# Patient Record
Sex: Female | Born: 1935 | Race: White | Hispanic: No | Smoking: Former smoker
Health system: Southern US, Community
[De-identification: ages and names within clinical notes are randomized; demographics above are authoritative.]

## PROBLEM LIST (undated history)

## (undated) DIAGNOSIS — H409 Unspecified glaucoma: Secondary | ICD-10-CM

## (undated) DIAGNOSIS — I4891 Unspecified atrial fibrillation: Secondary | ICD-10-CM

## (undated) DIAGNOSIS — J439 Emphysema, unspecified: Secondary | ICD-10-CM

## (undated) DIAGNOSIS — I509 Heart failure, unspecified: Secondary | ICD-10-CM

## (undated) HISTORY — PX: KNEE ARTHROSCOPY: SUR90

---

## 2011-03-10 DIAGNOSIS — Z7902 Long term (current) use of antithrombotics/antiplatelets: Secondary | ICD-10-CM | POA: Diagnosis not present

## 2011-03-10 DIAGNOSIS — Z7901 Long term (current) use of anticoagulants: Secondary | ICD-10-CM | POA: Diagnosis not present

## 2011-03-22 DIAGNOSIS — R791 Abnormal coagulation profile: Secondary | ICD-10-CM | POA: Diagnosis not present

## 2011-04-05 DIAGNOSIS — Z79899 Other long term (current) drug therapy: Secondary | ICD-10-CM | POA: Diagnosis not present

## 2011-04-05 DIAGNOSIS — J449 Chronic obstructive pulmonary disease, unspecified: Secondary | ICD-10-CM | POA: Diagnosis not present

## 2011-04-05 DIAGNOSIS — I4891 Unspecified atrial fibrillation: Secondary | ICD-10-CM | POA: Diagnosis not present

## 2011-04-05 DIAGNOSIS — I10 Essential (primary) hypertension: Secondary | ICD-10-CM | POA: Diagnosis not present

## 2011-04-05 DIAGNOSIS — R791 Abnormal coagulation profile: Secondary | ICD-10-CM | POA: Diagnosis not present

## 2011-04-05 DIAGNOSIS — E785 Hyperlipidemia, unspecified: Secondary | ICD-10-CM | POA: Diagnosis not present

## 2011-04-05 DIAGNOSIS — J4489 Other specified chronic obstructive pulmonary disease: Secondary | ICD-10-CM | POA: Diagnosis not present

## 2011-04-07 DIAGNOSIS — H4011X0 Primary open-angle glaucoma, stage unspecified: Secondary | ICD-10-CM | POA: Diagnosis not present

## 2011-04-26 DIAGNOSIS — R791 Abnormal coagulation profile: Secondary | ICD-10-CM | POA: Diagnosis not present

## 2011-05-06 DIAGNOSIS — S21209A Unspecified open wound of unspecified back wall of thorax without penetration into thoracic cavity, initial encounter: Secondary | ICD-10-CM | POA: Diagnosis not present

## 2011-05-17 DIAGNOSIS — R791 Abnormal coagulation profile: Secondary | ICD-10-CM | POA: Diagnosis not present

## 2011-05-19 DIAGNOSIS — R791 Abnormal coagulation profile: Secondary | ICD-10-CM | POA: Diagnosis not present

## 2011-06-01 DIAGNOSIS — R791 Abnormal coagulation profile: Secondary | ICD-10-CM | POA: Diagnosis not present

## 2011-06-17 DIAGNOSIS — Z5181 Encounter for therapeutic drug level monitoring: Secondary | ICD-10-CM | POA: Diagnosis not present

## 2011-06-17 DIAGNOSIS — Z7901 Long term (current) use of anticoagulants: Secondary | ICD-10-CM | POA: Diagnosis not present

## 2011-06-20 DIAGNOSIS — J189 Pneumonia, unspecified organism: Secondary | ICD-10-CM | POA: Diagnosis not present

## 2011-06-23 DIAGNOSIS — J189 Pneumonia, unspecified organism: Secondary | ICD-10-CM | POA: Diagnosis not present

## 2011-06-28 DIAGNOSIS — R791 Abnormal coagulation profile: Secondary | ICD-10-CM | POA: Diagnosis not present

## 2011-07-05 DIAGNOSIS — R791 Abnormal coagulation profile: Secondary | ICD-10-CM | POA: Diagnosis not present

## 2011-07-07 DIAGNOSIS — H4011X0 Primary open-angle glaucoma, stage unspecified: Secondary | ICD-10-CM | POA: Diagnosis not present

## 2011-07-09 DIAGNOSIS — J449 Chronic obstructive pulmonary disease, unspecified: Secondary | ICD-10-CM | POA: Diagnosis not present

## 2011-07-09 DIAGNOSIS — I4891 Unspecified atrial fibrillation: Secondary | ICD-10-CM | POA: Diagnosis not present

## 2011-07-09 DIAGNOSIS — J811 Chronic pulmonary edema: Secondary | ICD-10-CM | POA: Diagnosis not present

## 2011-07-09 DIAGNOSIS — I10 Essential (primary) hypertension: Secondary | ICD-10-CM | POA: Diagnosis not present

## 2011-07-09 DIAGNOSIS — E785 Hyperlipidemia, unspecified: Secondary | ICD-10-CM | POA: Diagnosis not present

## 2011-07-13 DIAGNOSIS — R791 Abnormal coagulation profile: Secondary | ICD-10-CM | POA: Diagnosis not present

## 2011-08-03 DIAGNOSIS — R791 Abnormal coagulation profile: Secondary | ICD-10-CM | POA: Diagnosis not present

## 2011-08-12 DIAGNOSIS — R791 Abnormal coagulation profile: Secondary | ICD-10-CM | POA: Diagnosis not present

## 2011-08-31 DIAGNOSIS — R791 Abnormal coagulation profile: Secondary | ICD-10-CM | POA: Diagnosis not present

## 2011-09-17 DIAGNOSIS — Z5181 Encounter for therapeutic drug level monitoring: Secondary | ICD-10-CM | POA: Diagnosis not present

## 2011-09-17 DIAGNOSIS — Z7901 Long term (current) use of anticoagulants: Secondary | ICD-10-CM | POA: Diagnosis not present

## 2011-10-07 DIAGNOSIS — H4011X0 Primary open-angle glaucoma, stage unspecified: Secondary | ICD-10-CM | POA: Diagnosis not present

## 2011-10-14 DIAGNOSIS — R791 Abnormal coagulation profile: Secondary | ICD-10-CM | POA: Diagnosis not present

## 2011-10-18 DIAGNOSIS — I4891 Unspecified atrial fibrillation: Secondary | ICD-10-CM | POA: Diagnosis not present

## 2011-10-18 DIAGNOSIS — I10 Essential (primary) hypertension: Secondary | ICD-10-CM | POA: Diagnosis not present

## 2011-10-18 DIAGNOSIS — E785 Hyperlipidemia, unspecified: Secondary | ICD-10-CM | POA: Diagnosis not present

## 2011-10-18 DIAGNOSIS — J449 Chronic obstructive pulmonary disease, unspecified: Secondary | ICD-10-CM | POA: Diagnosis not present

## 2011-11-16 DIAGNOSIS — R791 Abnormal coagulation profile: Secondary | ICD-10-CM | POA: Diagnosis not present

## 2011-12-02 DIAGNOSIS — Z23 Encounter for immunization: Secondary | ICD-10-CM | POA: Diagnosis not present

## 2011-12-06 DIAGNOSIS — E039 Hypothyroidism, unspecified: Secondary | ICD-10-CM | POA: Diagnosis not present

## 2011-12-06 DIAGNOSIS — I5030 Unspecified diastolic (congestive) heart failure: Secondary | ICD-10-CM | POA: Diagnosis not present

## 2011-12-06 DIAGNOSIS — I10 Essential (primary) hypertension: Secondary | ICD-10-CM | POA: Diagnosis not present

## 2011-12-09 DIAGNOSIS — I509 Heart failure, unspecified: Secondary | ICD-10-CM | POA: Diagnosis not present

## 2011-12-09 DIAGNOSIS — E039 Hypothyroidism, unspecified: Secondary | ICD-10-CM | POA: Diagnosis not present

## 2011-12-21 DIAGNOSIS — R791 Abnormal coagulation profile: Secondary | ICD-10-CM | POA: Diagnosis not present

## 2012-01-10 DIAGNOSIS — R791 Abnormal coagulation profile: Secondary | ICD-10-CM | POA: Diagnosis not present

## 2012-01-10 DIAGNOSIS — H4011X0 Primary open-angle glaucoma, stage unspecified: Secondary | ICD-10-CM | POA: Diagnosis not present

## 2012-01-13 DIAGNOSIS — R791 Abnormal coagulation profile: Secondary | ICD-10-CM | POA: Diagnosis not present

## 2012-01-17 DIAGNOSIS — I509 Heart failure, unspecified: Secondary | ICD-10-CM | POA: Diagnosis not present

## 2012-01-17 DIAGNOSIS — I10 Essential (primary) hypertension: Secondary | ICD-10-CM | POA: Diagnosis not present

## 2012-01-17 DIAGNOSIS — J449 Chronic obstructive pulmonary disease, unspecified: Secondary | ICD-10-CM | POA: Diagnosis not present

## 2012-01-17 DIAGNOSIS — I4891 Unspecified atrial fibrillation: Secondary | ICD-10-CM | POA: Diagnosis not present

## 2012-01-19 DIAGNOSIS — R791 Abnormal coagulation profile: Secondary | ICD-10-CM | POA: Diagnosis not present

## 2012-01-21 DIAGNOSIS — R791 Abnormal coagulation profile: Secondary | ICD-10-CM | POA: Diagnosis not present

## 2012-01-31 DIAGNOSIS — Z79899 Other long term (current) drug therapy: Secondary | ICD-10-CM | POA: Diagnosis not present

## 2012-01-31 DIAGNOSIS — R791 Abnormal coagulation profile: Secondary | ICD-10-CM | POA: Diagnosis not present

## 2012-01-31 DIAGNOSIS — R61 Generalized hyperhidrosis: Secondary | ICD-10-CM | POA: Diagnosis not present

## 2012-02-10 DIAGNOSIS — Z7901 Long term (current) use of anticoagulants: Secondary | ICD-10-CM | POA: Diagnosis not present

## 2012-03-03 DIAGNOSIS — R791 Abnormal coagulation profile: Secondary | ICD-10-CM | POA: Diagnosis not present

## 2012-03-24 DIAGNOSIS — Z7901 Long term (current) use of anticoagulants: Secondary | ICD-10-CM | POA: Diagnosis not present

## 2012-03-29 DIAGNOSIS — J209 Acute bronchitis, unspecified: Secondary | ICD-10-CM | POA: Diagnosis not present

## 2012-03-31 DIAGNOSIS — R791 Abnormal coagulation profile: Secondary | ICD-10-CM | POA: Diagnosis not present

## 2012-04-03 DIAGNOSIS — E785 Hyperlipidemia, unspecified: Secondary | ICD-10-CM | POA: Diagnosis not present

## 2012-04-03 DIAGNOSIS — M25579 Pain in unspecified ankle and joints of unspecified foot: Secondary | ICD-10-CM | POA: Diagnosis not present

## 2012-04-03 DIAGNOSIS — J449 Chronic obstructive pulmonary disease, unspecified: Secondary | ICD-10-CM | POA: Diagnosis not present

## 2012-04-03 DIAGNOSIS — I10 Essential (primary) hypertension: Secondary | ICD-10-CM | POA: Diagnosis not present

## 2012-04-03 DIAGNOSIS — I4891 Unspecified atrial fibrillation: Secondary | ICD-10-CM | POA: Diagnosis not present

## 2012-04-07 DIAGNOSIS — Z7901 Long term (current) use of anticoagulants: Secondary | ICD-10-CM | POA: Diagnosis not present

## 2012-04-11 DIAGNOSIS — H4011X0 Primary open-angle glaucoma, stage unspecified: Secondary | ICD-10-CM | POA: Diagnosis not present

## 2012-04-21 DIAGNOSIS — Z79899 Other long term (current) drug therapy: Secondary | ICD-10-CM | POA: Diagnosis not present

## 2012-04-24 DIAGNOSIS — J449 Chronic obstructive pulmonary disease, unspecified: Secondary | ICD-10-CM | POA: Diagnosis not present

## 2012-04-24 DIAGNOSIS — I4891 Unspecified atrial fibrillation: Secondary | ICD-10-CM | POA: Diagnosis not present

## 2012-05-05 DIAGNOSIS — R05 Cough: Secondary | ICD-10-CM | POA: Diagnosis not present

## 2012-05-07 DIAGNOSIS — E871 Hypo-osmolality and hyponatremia: Secondary | ICD-10-CM | POA: Diagnosis not present

## 2012-05-07 DIAGNOSIS — J441 Chronic obstructive pulmonary disease with (acute) exacerbation: Secondary | ICD-10-CM | POA: Diagnosis not present

## 2012-05-07 DIAGNOSIS — R05 Cough: Secondary | ICD-10-CM | POA: Diagnosis not present

## 2012-05-07 DIAGNOSIS — J309 Allergic rhinitis, unspecified: Secondary | ICD-10-CM | POA: Diagnosis present

## 2012-05-07 DIAGNOSIS — J9600 Acute respiratory failure, unspecified whether with hypoxia or hypercapnia: Secondary | ICD-10-CM | POA: Diagnosis not present

## 2012-05-07 DIAGNOSIS — Z87891 Personal history of nicotine dependence: Secondary | ICD-10-CM | POA: Diagnosis not present

## 2012-05-07 DIAGNOSIS — R319 Hematuria, unspecified: Secondary | ICD-10-CM | POA: Diagnosis not present

## 2012-05-07 DIAGNOSIS — I10 Essential (primary) hypertension: Secondary | ICD-10-CM | POA: Diagnosis present

## 2012-05-07 DIAGNOSIS — I509 Heart failure, unspecified: Secondary | ICD-10-CM | POA: Diagnosis present

## 2012-05-07 DIAGNOSIS — J440 Chronic obstructive pulmonary disease with acute lower respiratory infection: Secondary | ICD-10-CM | POA: Diagnosis not present

## 2012-05-07 DIAGNOSIS — Z885 Allergy status to narcotic agent status: Secondary | ICD-10-CM | POA: Diagnosis not present

## 2012-05-07 DIAGNOSIS — J209 Acute bronchitis, unspecified: Secondary | ICD-10-CM | POA: Diagnosis not present

## 2012-05-07 DIAGNOSIS — Z880 Allergy status to penicillin: Secondary | ICD-10-CM | POA: Diagnosis not present

## 2012-05-07 DIAGNOSIS — J189 Pneumonia, unspecified organism: Secondary | ICD-10-CM | POA: Diagnosis not present

## 2012-05-07 DIAGNOSIS — I059 Rheumatic mitral valve disease, unspecified: Secondary | ICD-10-CM | POA: Diagnosis not present

## 2012-05-07 DIAGNOSIS — I4891 Unspecified atrial fibrillation: Secondary | ICD-10-CM | POA: Diagnosis not present

## 2012-05-07 DIAGNOSIS — E785 Hyperlipidemia, unspecified: Secondary | ICD-10-CM | POA: Diagnosis present

## 2012-05-07 DIAGNOSIS — J9620 Acute and chronic respiratory failure, unspecified whether with hypoxia or hypercapnia: Secondary | ICD-10-CM | POA: Diagnosis present

## 2012-05-07 DIAGNOSIS — R059 Cough, unspecified: Secondary | ICD-10-CM | POA: Diagnosis not present

## 2012-05-07 DIAGNOSIS — E039 Hypothyroidism, unspecified: Secondary | ICD-10-CM | POA: Diagnosis present

## 2012-05-07 DIAGNOSIS — R9431 Abnormal electrocardiogram [ECG] [EKG]: Secondary | ICD-10-CM | POA: Diagnosis not present

## 2012-05-07 DIAGNOSIS — R0602 Shortness of breath: Secondary | ICD-10-CM | POA: Diagnosis not present

## 2012-05-07 DIAGNOSIS — Z7901 Long term (current) use of anticoagulants: Secondary | ICD-10-CM | POA: Diagnosis not present

## 2012-05-07 DIAGNOSIS — I5033 Acute on chronic diastolic (congestive) heart failure: Secondary | ICD-10-CM | POA: Diagnosis not present

## 2012-05-07 DIAGNOSIS — H409 Unspecified glaucoma: Secondary | ICD-10-CM | POA: Diagnosis present

## 2012-05-16 DIAGNOSIS — Z79899 Other long term (current) drug therapy: Secondary | ICD-10-CM | POA: Diagnosis not present

## 2012-05-16 DIAGNOSIS — R791 Abnormal coagulation profile: Secondary | ICD-10-CM | POA: Diagnosis not present

## 2012-05-19 DIAGNOSIS — R269 Unspecified abnormalities of gait and mobility: Secondary | ICD-10-CM | POA: Diagnosis not present

## 2012-05-19 DIAGNOSIS — IMO0001 Reserved for inherently not codable concepts without codable children: Secondary | ICD-10-CM | POA: Diagnosis not present

## 2012-05-19 DIAGNOSIS — I10 Essential (primary) hypertension: Secondary | ICD-10-CM | POA: Diagnosis not present

## 2012-05-19 DIAGNOSIS — I509 Heart failure, unspecified: Secondary | ICD-10-CM | POA: Diagnosis not present

## 2012-05-19 DIAGNOSIS — M6281 Muscle weakness (generalized): Secondary | ICD-10-CM | POA: Diagnosis not present

## 2012-05-22 DIAGNOSIS — I509 Heart failure, unspecified: Secondary | ICD-10-CM | POA: Diagnosis not present

## 2012-05-22 DIAGNOSIS — IMO0001 Reserved for inherently not codable concepts without codable children: Secondary | ICD-10-CM | POA: Diagnosis not present

## 2012-05-22 DIAGNOSIS — M6281 Muscle weakness (generalized): Secondary | ICD-10-CM | POA: Diagnosis not present

## 2012-05-22 DIAGNOSIS — R269 Unspecified abnormalities of gait and mobility: Secondary | ICD-10-CM | POA: Diagnosis not present

## 2012-05-22 DIAGNOSIS — I10 Essential (primary) hypertension: Secondary | ICD-10-CM | POA: Diagnosis not present

## 2012-05-24 DIAGNOSIS — M6281 Muscle weakness (generalized): Secondary | ICD-10-CM | POA: Diagnosis not present

## 2012-05-24 DIAGNOSIS — I10 Essential (primary) hypertension: Secondary | ICD-10-CM | POA: Diagnosis not present

## 2012-05-24 DIAGNOSIS — I509 Heart failure, unspecified: Secondary | ICD-10-CM | POA: Diagnosis not present

## 2012-05-24 DIAGNOSIS — IMO0001 Reserved for inherently not codable concepts without codable children: Secondary | ICD-10-CM | POA: Diagnosis not present

## 2012-05-24 DIAGNOSIS — R269 Unspecified abnormalities of gait and mobility: Secondary | ICD-10-CM | POA: Diagnosis not present

## 2012-05-26 DIAGNOSIS — M6281 Muscle weakness (generalized): Secondary | ICD-10-CM | POA: Diagnosis not present

## 2012-05-26 DIAGNOSIS — IMO0001 Reserved for inherently not codable concepts without codable children: Secondary | ICD-10-CM | POA: Diagnosis not present

## 2012-05-26 DIAGNOSIS — I509 Heart failure, unspecified: Secondary | ICD-10-CM | POA: Diagnosis not present

## 2012-05-26 DIAGNOSIS — Z7901 Long term (current) use of anticoagulants: Secondary | ICD-10-CM | POA: Diagnosis not present

## 2012-05-26 DIAGNOSIS — R269 Unspecified abnormalities of gait and mobility: Secondary | ICD-10-CM | POA: Diagnosis not present

## 2012-05-26 DIAGNOSIS — I10 Essential (primary) hypertension: Secondary | ICD-10-CM | POA: Diagnosis not present

## 2012-05-29 DIAGNOSIS — I10 Essential (primary) hypertension: Secondary | ICD-10-CM | POA: Diagnosis not present

## 2012-05-29 DIAGNOSIS — I509 Heart failure, unspecified: Secondary | ICD-10-CM | POA: Diagnosis not present

## 2012-05-29 DIAGNOSIS — R269 Unspecified abnormalities of gait and mobility: Secondary | ICD-10-CM | POA: Diagnosis not present

## 2012-05-29 DIAGNOSIS — IMO0001 Reserved for inherently not codable concepts without codable children: Secondary | ICD-10-CM | POA: Diagnosis not present

## 2012-05-29 DIAGNOSIS — M6281 Muscle weakness (generalized): Secondary | ICD-10-CM | POA: Diagnosis not present

## 2012-05-30 DIAGNOSIS — IMO0001 Reserved for inherently not codable concepts without codable children: Secondary | ICD-10-CM | POA: Diagnosis not present

## 2012-05-30 DIAGNOSIS — I509 Heart failure, unspecified: Secondary | ICD-10-CM | POA: Diagnosis not present

## 2012-05-30 DIAGNOSIS — M6281 Muscle weakness (generalized): Secondary | ICD-10-CM | POA: Diagnosis not present

## 2012-05-30 DIAGNOSIS — R269 Unspecified abnormalities of gait and mobility: Secondary | ICD-10-CM | POA: Diagnosis not present

## 2012-05-30 DIAGNOSIS — I10 Essential (primary) hypertension: Secondary | ICD-10-CM | POA: Diagnosis not present

## 2012-06-01 DIAGNOSIS — R791 Abnormal coagulation profile: Secondary | ICD-10-CM | POA: Diagnosis not present

## 2012-06-01 DIAGNOSIS — R269 Unspecified abnormalities of gait and mobility: Secondary | ICD-10-CM | POA: Diagnosis not present

## 2012-06-01 DIAGNOSIS — I10 Essential (primary) hypertension: Secondary | ICD-10-CM | POA: Diagnosis not present

## 2012-06-01 DIAGNOSIS — IMO0001 Reserved for inherently not codable concepts without codable children: Secondary | ICD-10-CM | POA: Diagnosis not present

## 2012-06-01 DIAGNOSIS — I509 Heart failure, unspecified: Secondary | ICD-10-CM | POA: Diagnosis not present

## 2012-06-01 DIAGNOSIS — M6281 Muscle weakness (generalized): Secondary | ICD-10-CM | POA: Diagnosis not present

## 2012-06-06 DIAGNOSIS — I10 Essential (primary) hypertension: Secondary | ICD-10-CM | POA: Diagnosis not present

## 2012-06-06 DIAGNOSIS — R269 Unspecified abnormalities of gait and mobility: Secondary | ICD-10-CM | POA: Diagnosis not present

## 2012-06-06 DIAGNOSIS — I509 Heart failure, unspecified: Secondary | ICD-10-CM | POA: Diagnosis not present

## 2012-06-06 DIAGNOSIS — IMO0001 Reserved for inherently not codable concepts without codable children: Secondary | ICD-10-CM | POA: Diagnosis not present

## 2012-06-06 DIAGNOSIS — M6281 Muscle weakness (generalized): Secondary | ICD-10-CM | POA: Diagnosis not present

## 2012-06-08 DIAGNOSIS — M6281 Muscle weakness (generalized): Secondary | ICD-10-CM | POA: Diagnosis not present

## 2012-06-08 DIAGNOSIS — IMO0001 Reserved for inherently not codable concepts without codable children: Secondary | ICD-10-CM | POA: Diagnosis not present

## 2012-06-08 DIAGNOSIS — I509 Heart failure, unspecified: Secondary | ICD-10-CM | POA: Diagnosis not present

## 2012-06-08 DIAGNOSIS — I10 Essential (primary) hypertension: Secondary | ICD-10-CM | POA: Diagnosis not present

## 2012-06-08 DIAGNOSIS — R269 Unspecified abnormalities of gait and mobility: Secondary | ICD-10-CM | POA: Diagnosis not present

## 2012-06-09 DIAGNOSIS — Z7901 Long term (current) use of anticoagulants: Secondary | ICD-10-CM | POA: Diagnosis not present

## 2012-06-12 DIAGNOSIS — I509 Heart failure, unspecified: Secondary | ICD-10-CM | POA: Diagnosis not present

## 2012-06-12 DIAGNOSIS — M6281 Muscle weakness (generalized): Secondary | ICD-10-CM | POA: Diagnosis not present

## 2012-06-12 DIAGNOSIS — I10 Essential (primary) hypertension: Secondary | ICD-10-CM | POA: Diagnosis not present

## 2012-06-12 DIAGNOSIS — IMO0001 Reserved for inherently not codable concepts without codable children: Secondary | ICD-10-CM | POA: Diagnosis not present

## 2012-06-12 DIAGNOSIS — R269 Unspecified abnormalities of gait and mobility: Secondary | ICD-10-CM | POA: Diagnosis not present

## 2012-06-15 DIAGNOSIS — IMO0001 Reserved for inherently not codable concepts without codable children: Secondary | ICD-10-CM | POA: Diagnosis not present

## 2012-06-15 DIAGNOSIS — R269 Unspecified abnormalities of gait and mobility: Secondary | ICD-10-CM | POA: Diagnosis not present

## 2012-06-15 DIAGNOSIS — I509 Heart failure, unspecified: Secondary | ICD-10-CM | POA: Diagnosis not present

## 2012-06-15 DIAGNOSIS — M6281 Muscle weakness (generalized): Secondary | ICD-10-CM | POA: Diagnosis not present

## 2012-06-15 DIAGNOSIS — I10 Essential (primary) hypertension: Secondary | ICD-10-CM | POA: Diagnosis not present

## 2012-06-23 DIAGNOSIS — Z7901 Long term (current) use of anticoagulants: Secondary | ICD-10-CM | POA: Diagnosis not present

## 2012-06-27 DIAGNOSIS — J449 Chronic obstructive pulmonary disease, unspecified: Secondary | ICD-10-CM | POA: Diagnosis not present

## 2012-07-05 DIAGNOSIS — R791 Abnormal coagulation profile: Secondary | ICD-10-CM | POA: Diagnosis not present

## 2012-07-10 DIAGNOSIS — H4011X0 Primary open-angle glaucoma, stage unspecified: Secondary | ICD-10-CM | POA: Diagnosis not present

## 2012-07-11 DIAGNOSIS — I4891 Unspecified atrial fibrillation: Secondary | ICD-10-CM | POA: Diagnosis not present

## 2012-07-18 DIAGNOSIS — R791 Abnormal coagulation profile: Secondary | ICD-10-CM | POA: Diagnosis not present

## 2012-07-25 DIAGNOSIS — Z5181 Encounter for therapeutic drug level monitoring: Secondary | ICD-10-CM | POA: Diagnosis not present

## 2012-07-25 DIAGNOSIS — Z7901 Long term (current) use of anticoagulants: Secondary | ICD-10-CM | POA: Diagnosis not present

## 2012-07-25 DIAGNOSIS — Z79899 Other long term (current) drug therapy: Secondary | ICD-10-CM | POA: Diagnosis not present

## 2012-08-10 DIAGNOSIS — Z7901 Long term (current) use of anticoagulants: Secondary | ICD-10-CM | POA: Diagnosis not present

## 2012-09-07 DIAGNOSIS — Z7901 Long term (current) use of anticoagulants: Secondary | ICD-10-CM | POA: Diagnosis not present

## 2012-09-18 DIAGNOSIS — J0100 Acute maxillary sinusitis, unspecified: Secondary | ICD-10-CM | POA: Diagnosis not present

## 2012-09-18 DIAGNOSIS — R05 Cough: Secondary | ICD-10-CM | POA: Diagnosis not present

## 2012-09-18 DIAGNOSIS — R059 Cough, unspecified: Secondary | ICD-10-CM | POA: Diagnosis not present

## 2012-09-18 DIAGNOSIS — M47814 Spondylosis without myelopathy or radiculopathy, thoracic region: Secondary | ICD-10-CM | POA: Diagnosis not present

## 2012-09-20 DIAGNOSIS — Z7901 Long term (current) use of anticoagulants: Secondary | ICD-10-CM | POA: Diagnosis not present

## 2012-09-20 DIAGNOSIS — Z79899 Other long term (current) drug therapy: Secondary | ICD-10-CM | POA: Diagnosis not present

## 2012-09-21 DIAGNOSIS — Z7901 Long term (current) use of anticoagulants: Secondary | ICD-10-CM | POA: Diagnosis not present

## 2012-09-22 DIAGNOSIS — Z5181 Encounter for therapeutic drug level monitoring: Secondary | ICD-10-CM | POA: Diagnosis not present

## 2012-09-22 DIAGNOSIS — Z79899 Other long term (current) drug therapy: Secondary | ICD-10-CM | POA: Diagnosis not present

## 2012-09-25 DIAGNOSIS — Z7901 Long term (current) use of anticoagulants: Secondary | ICD-10-CM | POA: Diagnosis not present

## 2012-10-02 DIAGNOSIS — Z7901 Long term (current) use of anticoagulants: Secondary | ICD-10-CM | POA: Diagnosis not present

## 2012-10-10 DIAGNOSIS — Z7901 Long term (current) use of anticoagulants: Secondary | ICD-10-CM | POA: Diagnosis not present

## 2012-10-11 DIAGNOSIS — H4011X0 Primary open-angle glaucoma, stage unspecified: Secondary | ICD-10-CM | POA: Diagnosis not present

## 2012-10-19 DIAGNOSIS — Z7901 Long term (current) use of anticoagulants: Secondary | ICD-10-CM | POA: Diagnosis not present

## 2012-11-21 DIAGNOSIS — Z7901 Long term (current) use of anticoagulants: Secondary | ICD-10-CM | POA: Diagnosis not present

## 2012-11-21 DIAGNOSIS — Z7902 Long term (current) use of antithrombotics/antiplatelets: Secondary | ICD-10-CM | POA: Diagnosis not present

## 2012-12-06 DIAGNOSIS — Z7901 Long term (current) use of anticoagulants: Secondary | ICD-10-CM | POA: Diagnosis not present

## 2012-12-06 DIAGNOSIS — Z5181 Encounter for therapeutic drug level monitoring: Secondary | ICD-10-CM | POA: Diagnosis not present

## 2012-12-14 DIAGNOSIS — Z23 Encounter for immunization: Secondary | ICD-10-CM | POA: Diagnosis not present

## 2012-12-18 DIAGNOSIS — I10 Essential (primary) hypertension: Secondary | ICD-10-CM | POA: Diagnosis not present

## 2012-12-18 DIAGNOSIS — E785 Hyperlipidemia, unspecified: Secondary | ICD-10-CM | POA: Diagnosis not present

## 2012-12-18 DIAGNOSIS — I4891 Unspecified atrial fibrillation: Secondary | ICD-10-CM | POA: Diagnosis not present

## 2012-12-18 DIAGNOSIS — J449 Chronic obstructive pulmonary disease, unspecified: Secondary | ICD-10-CM | POA: Diagnosis not present

## 2012-12-26 DIAGNOSIS — E119 Type 2 diabetes mellitus without complications: Secondary | ICD-10-CM | POA: Diagnosis not present

## 2012-12-26 DIAGNOSIS — Z79899 Other long term (current) drug therapy: Secondary | ICD-10-CM | POA: Diagnosis not present

## 2012-12-29 DIAGNOSIS — E039 Hypothyroidism, unspecified: Secondary | ICD-10-CM | POA: Diagnosis not present

## 2013-01-04 DIAGNOSIS — L723 Sebaceous cyst: Secondary | ICD-10-CM | POA: Diagnosis not present

## 2013-01-04 DIAGNOSIS — L981 Factitial dermatitis: Secondary | ICD-10-CM | POA: Diagnosis not present

## 2013-01-04 DIAGNOSIS — L738 Other specified follicular disorders: Secondary | ICD-10-CM | POA: Diagnosis not present

## 2013-01-08 DIAGNOSIS — Z7901 Long term (current) use of anticoagulants: Secondary | ICD-10-CM | POA: Diagnosis not present

## 2013-01-15 DIAGNOSIS — H4011X0 Primary open-angle glaucoma, stage unspecified: Secondary | ICD-10-CM | POA: Diagnosis not present

## 2013-01-16 DIAGNOSIS — Z7901 Long term (current) use of anticoagulants: Secondary | ICD-10-CM | POA: Diagnosis not present

## 2013-01-29 DIAGNOSIS — Z5181 Encounter for therapeutic drug level monitoring: Secondary | ICD-10-CM | POA: Diagnosis not present

## 2013-01-29 DIAGNOSIS — Z7901 Long term (current) use of anticoagulants: Secondary | ICD-10-CM | POA: Diagnosis not present

## 2013-02-05 DIAGNOSIS — B351 Tinea unguium: Secondary | ICD-10-CM | POA: Diagnosis not present

## 2013-02-14 DIAGNOSIS — Z7901 Long term (current) use of anticoagulants: Secondary | ICD-10-CM | POA: Diagnosis not present

## 2013-03-03 DIAGNOSIS — H6120 Impacted cerumen, unspecified ear: Secondary | ICD-10-CM | POA: Diagnosis not present

## 2013-03-03 DIAGNOSIS — J069 Acute upper respiratory infection, unspecified: Secondary | ICD-10-CM | POA: Diagnosis not present

## 2013-03-16 DIAGNOSIS — Z5181 Encounter for therapeutic drug level monitoring: Secondary | ICD-10-CM | POA: Diagnosis not present

## 2013-03-16 DIAGNOSIS — Z7901 Long term (current) use of anticoagulants: Secondary | ICD-10-CM | POA: Diagnosis not present

## 2013-03-21 DIAGNOSIS — Z5181 Encounter for therapeutic drug level monitoring: Secondary | ICD-10-CM | POA: Diagnosis not present

## 2013-03-21 DIAGNOSIS — Z7901 Long term (current) use of anticoagulants: Secondary | ICD-10-CM | POA: Diagnosis not present

## 2013-03-22 DIAGNOSIS — I10 Essential (primary) hypertension: Secondary | ICD-10-CM | POA: Diagnosis not present

## 2013-03-22 DIAGNOSIS — I4891 Unspecified atrial fibrillation: Secondary | ICD-10-CM | POA: Diagnosis not present

## 2013-03-22 DIAGNOSIS — J449 Chronic obstructive pulmonary disease, unspecified: Secondary | ICD-10-CM | POA: Diagnosis not present

## 2013-03-22 DIAGNOSIS — E785 Hyperlipidemia, unspecified: Secondary | ICD-10-CM | POA: Diagnosis not present

## 2013-04-02 DIAGNOSIS — Z5181 Encounter for therapeutic drug level monitoring: Secondary | ICD-10-CM | POA: Diagnosis not present

## 2013-04-02 DIAGNOSIS — Z7901 Long term (current) use of anticoagulants: Secondary | ICD-10-CM | POA: Diagnosis not present

## 2013-04-10 DIAGNOSIS — H4011X0 Primary open-angle glaucoma, stage unspecified: Secondary | ICD-10-CM | POA: Diagnosis not present

## 2013-04-13 DIAGNOSIS — Z7901 Long term (current) use of anticoagulants: Secondary | ICD-10-CM | POA: Diagnosis not present

## 2013-04-16 DIAGNOSIS — M79609 Pain in unspecified limb: Secondary | ICD-10-CM | POA: Diagnosis not present

## 2013-04-16 DIAGNOSIS — B351 Tinea unguium: Secondary | ICD-10-CM | POA: Diagnosis not present

## 2013-04-16 DIAGNOSIS — G589 Mononeuropathy, unspecified: Secondary | ICD-10-CM | POA: Diagnosis not present

## 2013-04-16 DIAGNOSIS — I70209 Unspecified atherosclerosis of native arteries of extremities, unspecified extremity: Secondary | ICD-10-CM | POA: Diagnosis not present

## 2013-04-21 DIAGNOSIS — Z5181 Encounter for therapeutic drug level monitoring: Secondary | ICD-10-CM | POA: Diagnosis not present

## 2013-04-21 DIAGNOSIS — Z7901 Long term (current) use of anticoagulants: Secondary | ICD-10-CM | POA: Diagnosis not present

## 2013-04-28 DIAGNOSIS — Z7901 Long term (current) use of anticoagulants: Secondary | ICD-10-CM | POA: Diagnosis not present

## 2013-05-04 DIAGNOSIS — Z7901 Long term (current) use of anticoagulants: Secondary | ICD-10-CM | POA: Diagnosis not present

## 2013-05-10 DIAGNOSIS — Z7901 Long term (current) use of anticoagulants: Secondary | ICD-10-CM | POA: Diagnosis not present

## 2013-05-19 DIAGNOSIS — R791 Abnormal coagulation profile: Secondary | ICD-10-CM | POA: Diagnosis not present

## 2013-05-20 DIAGNOSIS — J449 Chronic obstructive pulmonary disease, unspecified: Secondary | ICD-10-CM | POA: Diagnosis not present

## 2013-05-20 DIAGNOSIS — J029 Acute pharyngitis, unspecified: Secondary | ICD-10-CM | POA: Diagnosis not present

## 2013-05-25 DIAGNOSIS — Z7901 Long term (current) use of anticoagulants: Secondary | ICD-10-CM | POA: Diagnosis not present

## 2013-06-01 DIAGNOSIS — Z7901 Long term (current) use of anticoagulants: Secondary | ICD-10-CM | POA: Diagnosis not present

## 2013-06-08 DIAGNOSIS — Z7901 Long term (current) use of anticoagulants: Secondary | ICD-10-CM | POA: Diagnosis not present

## 2013-06-13 DIAGNOSIS — E785 Hyperlipidemia, unspecified: Secondary | ICD-10-CM | POA: Diagnosis not present

## 2013-06-13 DIAGNOSIS — J9620 Acute and chronic respiratory failure, unspecified whether with hypoxia or hypercapnia: Secondary | ICD-10-CM | POA: Diagnosis not present

## 2013-06-13 DIAGNOSIS — Z7901 Long term (current) use of anticoagulants: Secondary | ICD-10-CM | POA: Diagnosis not present

## 2013-06-13 DIAGNOSIS — I10 Essential (primary) hypertension: Secondary | ICD-10-CM | POA: Diagnosis not present

## 2013-06-13 DIAGNOSIS — J449 Chronic obstructive pulmonary disease, unspecified: Secondary | ICD-10-CM | POA: Diagnosis not present

## 2013-06-15 DIAGNOSIS — Z7901 Long term (current) use of anticoagulants: Secondary | ICD-10-CM | POA: Diagnosis not present

## 2013-06-21 DIAGNOSIS — L0390 Cellulitis, unspecified: Secondary | ICD-10-CM | POA: Diagnosis not present

## 2013-06-21 DIAGNOSIS — L0291 Cutaneous abscess, unspecified: Secondary | ICD-10-CM | POA: Diagnosis not present

## 2013-06-21 DIAGNOSIS — R609 Edema, unspecified: Secondary | ICD-10-CM | POA: Diagnosis not present

## 2013-06-22 DIAGNOSIS — M79609 Pain in unspecified limb: Secondary | ICD-10-CM | POA: Diagnosis not present

## 2013-06-29 DIAGNOSIS — Z7901 Long term (current) use of anticoagulants: Secondary | ICD-10-CM | POA: Diagnosis not present

## 2013-07-02 DIAGNOSIS — Z7901 Long term (current) use of anticoagulants: Secondary | ICD-10-CM | POA: Diagnosis not present

## 2013-07-04 DIAGNOSIS — Z79899 Other long term (current) drug therapy: Secondary | ICD-10-CM | POA: Diagnosis not present

## 2013-07-04 DIAGNOSIS — I509 Heart failure, unspecified: Secondary | ICD-10-CM | POA: Diagnosis not present

## 2013-07-04 DIAGNOSIS — R062 Wheezing: Secondary | ICD-10-CM | POA: Diagnosis not present

## 2013-07-05 DIAGNOSIS — E875 Hyperkalemia: Secondary | ICD-10-CM | POA: Diagnosis not present

## 2013-07-05 DIAGNOSIS — L02419 Cutaneous abscess of limb, unspecified: Secondary | ICD-10-CM | POA: Diagnosis not present

## 2013-07-17 DIAGNOSIS — H4011X0 Primary open-angle glaucoma, stage unspecified: Secondary | ICD-10-CM | POA: Diagnosis not present

## 2013-07-17 DIAGNOSIS — Z7901 Long term (current) use of anticoagulants: Secondary | ICD-10-CM | POA: Diagnosis not present

## 2013-07-17 DIAGNOSIS — Z79899 Other long term (current) drug therapy: Secondary | ICD-10-CM | POA: Diagnosis not present

## 2013-07-18 DIAGNOSIS — B351 Tinea unguium: Secondary | ICD-10-CM | POA: Diagnosis not present

## 2013-07-18 DIAGNOSIS — M79609 Pain in unspecified limb: Secondary | ICD-10-CM | POA: Diagnosis not present

## 2013-07-18 DIAGNOSIS — M2010 Hallux valgus (acquired), unspecified foot: Secondary | ICD-10-CM | POA: Diagnosis not present

## 2013-07-18 DIAGNOSIS — I509 Heart failure, unspecified: Secondary | ICD-10-CM | POA: Diagnosis not present

## 2013-08-01 DIAGNOSIS — Z7901 Long term (current) use of anticoagulants: Secondary | ICD-10-CM | POA: Diagnosis not present

## 2013-08-29 DIAGNOSIS — Z7901 Long term (current) use of anticoagulants: Secondary | ICD-10-CM | POA: Diagnosis not present

## 2013-08-30 DIAGNOSIS — R0602 Shortness of breath: Secondary | ICD-10-CM | POA: Diagnosis not present

## 2013-08-30 DIAGNOSIS — R079 Chest pain, unspecified: Secondary | ICD-10-CM | POA: Diagnosis not present

## 2013-08-30 DIAGNOSIS — J441 Chronic obstructive pulmonary disease with (acute) exacerbation: Secondary | ICD-10-CM | POA: Diagnosis not present

## 2013-08-30 DIAGNOSIS — I519 Heart disease, unspecified: Secondary | ICD-10-CM | POA: Diagnosis not present

## 2013-09-05 DIAGNOSIS — J449 Chronic obstructive pulmonary disease, unspecified: Secondary | ICD-10-CM | POA: Diagnosis not present

## 2013-09-20 DIAGNOSIS — J9610 Chronic respiratory failure, unspecified whether with hypoxia or hypercapnia: Secondary | ICD-10-CM | POA: Diagnosis not present

## 2013-09-20 DIAGNOSIS — J449 Chronic obstructive pulmonary disease, unspecified: Secondary | ICD-10-CM | POA: Diagnosis not present

## 2013-10-04 DIAGNOSIS — Z7901 Long term (current) use of anticoagulants: Secondary | ICD-10-CM | POA: Diagnosis not present

## 2013-10-10 DIAGNOSIS — I4891 Unspecified atrial fibrillation: Secondary | ICD-10-CM | POA: Diagnosis not present

## 2013-10-10 DIAGNOSIS — I10 Essential (primary) hypertension: Secondary | ICD-10-CM | POA: Diagnosis not present

## 2013-10-10 DIAGNOSIS — E039 Hypothyroidism, unspecified: Secondary | ICD-10-CM | POA: Diagnosis not present

## 2013-10-10 DIAGNOSIS — J449 Chronic obstructive pulmonary disease, unspecified: Secondary | ICD-10-CM | POA: Diagnosis not present

## 2013-10-11 DIAGNOSIS — I10 Essential (primary) hypertension: Secondary | ICD-10-CM | POA: Diagnosis not present

## 2013-10-22 DIAGNOSIS — B351 Tinea unguium: Secondary | ICD-10-CM | POA: Diagnosis not present

## 2013-10-22 DIAGNOSIS — M79609 Pain in unspecified limb: Secondary | ICD-10-CM | POA: Diagnosis not present

## 2013-10-22 DIAGNOSIS — M2040 Other hammer toe(s) (acquired), unspecified foot: Secondary | ICD-10-CM | POA: Diagnosis not present

## 2013-10-22 DIAGNOSIS — R609 Edema, unspecified: Secondary | ICD-10-CM | POA: Diagnosis not present

## 2013-10-29 DIAGNOSIS — H4011X0 Primary open-angle glaucoma, stage unspecified: Secondary | ICD-10-CM | POA: Diagnosis not present

## 2013-10-29 DIAGNOSIS — H524 Presbyopia: Secondary | ICD-10-CM | POA: Diagnosis not present

## 2013-11-02 DIAGNOSIS — Z7901 Long term (current) use of anticoagulants: Secondary | ICD-10-CM | POA: Diagnosis not present

## 2013-11-02 DIAGNOSIS — K921 Melena: Secondary | ICD-10-CM | POA: Diagnosis not present

## 2013-11-21 DIAGNOSIS — H1130 Conjunctival hemorrhage, unspecified eye: Secondary | ICD-10-CM | POA: Diagnosis not present

## 2013-11-22 DIAGNOSIS — Z7901 Long term (current) use of anticoagulants: Secondary | ICD-10-CM | POA: Diagnosis not present

## 2013-11-22 DIAGNOSIS — H1130 Conjunctival hemorrhage, unspecified eye: Secondary | ICD-10-CM | POA: Diagnosis not present

## 2013-11-23 DIAGNOSIS — Z7901 Long term (current) use of anticoagulants: Secondary | ICD-10-CM | POA: Diagnosis not present

## 2013-12-10 DIAGNOSIS — I4891 Unspecified atrial fibrillation: Secondary | ICD-10-CM | POA: Diagnosis not present

## 2013-12-10 DIAGNOSIS — R609 Edema, unspecified: Secondary | ICD-10-CM | POA: Diagnosis not present

## 2013-12-10 DIAGNOSIS — Z23 Encounter for immunization: Secondary | ICD-10-CM | POA: Diagnosis not present

## 2013-12-11 DIAGNOSIS — M7989 Other specified soft tissue disorders: Secondary | ICD-10-CM | POA: Diagnosis not present

## 2013-12-24 DIAGNOSIS — Z7901 Long term (current) use of anticoagulants: Secondary | ICD-10-CM | POA: Diagnosis not present

## 2014-01-03 DIAGNOSIS — E039 Hypothyroidism, unspecified: Secondary | ICD-10-CM | POA: Diagnosis not present

## 2014-01-03 DIAGNOSIS — I4891 Unspecified atrial fibrillation: Secondary | ICD-10-CM | POA: Diagnosis not present

## 2014-01-03 DIAGNOSIS — I10 Essential (primary) hypertension: Secondary | ICD-10-CM | POA: Diagnosis not present

## 2014-01-03 DIAGNOSIS — E785 Hyperlipidemia, unspecified: Secondary | ICD-10-CM | POA: Diagnosis not present

## 2014-01-07 DIAGNOSIS — Z7901 Long term (current) use of anticoagulants: Secondary | ICD-10-CM | POA: Diagnosis not present

## 2014-01-07 DIAGNOSIS — Z79891 Long term (current) use of opiate analgesic: Secondary | ICD-10-CM | POA: Diagnosis not present

## 2014-01-29 DIAGNOSIS — H2512 Age-related nuclear cataract, left eye: Secondary | ICD-10-CM | POA: Diagnosis not present

## 2014-01-29 DIAGNOSIS — H4011X1 Primary open-angle glaucoma, mild stage: Secondary | ICD-10-CM | POA: Diagnosis not present

## 2014-02-07 DIAGNOSIS — L304 Erythema intertrigo: Secondary | ICD-10-CM | POA: Diagnosis not present

## 2014-04-04 DIAGNOSIS — I10 Essential (primary) hypertension: Secondary | ICD-10-CM | POA: Diagnosis not present

## 2014-04-04 DIAGNOSIS — I4891 Unspecified atrial fibrillation: Secondary | ICD-10-CM | POA: Diagnosis not present

## 2014-04-04 DIAGNOSIS — E039 Hypothyroidism, unspecified: Secondary | ICD-10-CM | POA: Diagnosis not present

## 2014-04-04 DIAGNOSIS — J449 Chronic obstructive pulmonary disease, unspecified: Secondary | ICD-10-CM | POA: Diagnosis not present

## 2014-05-02 DIAGNOSIS — H4011X1 Primary open-angle glaucoma, mild stage: Secondary | ICD-10-CM | POA: Diagnosis not present

## 2014-07-18 DIAGNOSIS — J449 Chronic obstructive pulmonary disease, unspecified: Secondary | ICD-10-CM | POA: Diagnosis not present

## 2014-07-18 DIAGNOSIS — E785 Hyperlipidemia, unspecified: Secondary | ICD-10-CM | POA: Diagnosis not present

## 2014-07-18 DIAGNOSIS — I482 Chronic atrial fibrillation: Secondary | ICD-10-CM | POA: Diagnosis not present

## 2014-07-18 DIAGNOSIS — M810 Age-related osteoporosis without current pathological fracture: Secondary | ICD-10-CM | POA: Diagnosis not present

## 2014-07-26 DIAGNOSIS — R7309 Other abnormal glucose: Secondary | ICD-10-CM | POA: Diagnosis not present

## 2014-07-26 DIAGNOSIS — D649 Anemia, unspecified: Secondary | ICD-10-CM | POA: Diagnosis not present

## 2014-07-26 DIAGNOSIS — E876 Hypokalemia: Secondary | ICD-10-CM | POA: Diagnosis not present

## 2014-07-26 DIAGNOSIS — E789 Disorder of lipoprotein metabolism, unspecified: Secondary | ICD-10-CM | POA: Diagnosis not present

## 2014-07-26 DIAGNOSIS — E039 Hypothyroidism, unspecified: Secondary | ICD-10-CM | POA: Diagnosis not present

## 2014-07-27 DIAGNOSIS — H6123 Impacted cerumen, bilateral: Secondary | ICD-10-CM | POA: Diagnosis not present

## 2014-08-01 DIAGNOSIS — H4011X1 Primary open-angle glaucoma, mild stage: Secondary | ICD-10-CM | POA: Diagnosis not present

## 2014-10-17 DIAGNOSIS — E039 Hypothyroidism, unspecified: Secondary | ICD-10-CM | POA: Diagnosis not present

## 2014-10-17 DIAGNOSIS — M810 Age-related osteoporosis without current pathological fracture: Secondary | ICD-10-CM | POA: Diagnosis not present

## 2014-10-17 DIAGNOSIS — J449 Chronic obstructive pulmonary disease, unspecified: Secondary | ICD-10-CM | POA: Diagnosis not present

## 2014-10-17 DIAGNOSIS — I10 Essential (primary) hypertension: Secondary | ICD-10-CM | POA: Diagnosis not present

## 2014-10-17 DIAGNOSIS — I4891 Unspecified atrial fibrillation: Secondary | ICD-10-CM | POA: Diagnosis not present

## 2014-10-17 DIAGNOSIS — E785 Hyperlipidemia, unspecified: Secondary | ICD-10-CM | POA: Diagnosis not present

## 2014-10-23 DIAGNOSIS — M810 Age-related osteoporosis without current pathological fracture: Secondary | ICD-10-CM | POA: Diagnosis not present

## 2014-10-31 DIAGNOSIS — H4011X1 Primary open-angle glaucoma, mild stage: Secondary | ICD-10-CM | POA: Diagnosis not present

## 2014-12-05 DIAGNOSIS — Z23 Encounter for immunization: Secondary | ICD-10-CM | POA: Diagnosis not present

## 2015-01-16 DIAGNOSIS — E785 Hyperlipidemia, unspecified: Secondary | ICD-10-CM | POA: Diagnosis not present

## 2015-01-16 DIAGNOSIS — I10 Essential (primary) hypertension: Secondary | ICD-10-CM | POA: Diagnosis not present

## 2015-01-16 DIAGNOSIS — I4891 Unspecified atrial fibrillation: Secondary | ICD-10-CM | POA: Diagnosis not present

## 2015-01-16 DIAGNOSIS — J449 Chronic obstructive pulmonary disease, unspecified: Secondary | ICD-10-CM | POA: Diagnosis not present

## 2015-01-23 DIAGNOSIS — R54 Age-related physical debility: Secondary | ICD-10-CM | POA: Diagnosis not present

## 2015-01-23 DIAGNOSIS — J449 Chronic obstructive pulmonary disease, unspecified: Secondary | ICD-10-CM | POA: Diagnosis not present

## 2015-01-23 DIAGNOSIS — R05 Cough: Secondary | ICD-10-CM | POA: Diagnosis not present

## 2015-01-23 DIAGNOSIS — R0989 Other specified symptoms and signs involving the circulatory and respiratory systems: Secondary | ICD-10-CM | POA: Diagnosis not present

## 2015-01-27 DIAGNOSIS — R0989 Other specified symptoms and signs involving the circulatory and respiratory systems: Secondary | ICD-10-CM | POA: Diagnosis not present

## 2015-01-27 DIAGNOSIS — R05 Cough: Secondary | ICD-10-CM | POA: Diagnosis not present

## 2015-01-28 DIAGNOSIS — H401131 Primary open-angle glaucoma, bilateral, mild stage: Secondary | ICD-10-CM | POA: Diagnosis not present

## 2015-03-27 DIAGNOSIS — R54 Age-related physical debility: Secondary | ICD-10-CM | POA: Diagnosis not present

## 2015-03-27 DIAGNOSIS — R05 Cough: Secondary | ICD-10-CM | POA: Diagnosis not present

## 2015-03-27 DIAGNOSIS — R0989 Other specified symptoms and signs involving the circulatory and respiratory systems: Secondary | ICD-10-CM | POA: Diagnosis not present

## 2015-03-27 DIAGNOSIS — J449 Chronic obstructive pulmonary disease, unspecified: Secondary | ICD-10-CM | POA: Diagnosis not present

## 2015-03-27 DIAGNOSIS — R062 Wheezing: Secondary | ICD-10-CM | POA: Diagnosis not present

## 2015-04-10 DIAGNOSIS — E785 Hyperlipidemia, unspecified: Secondary | ICD-10-CM | POA: Diagnosis not present

## 2015-04-10 DIAGNOSIS — J449 Chronic obstructive pulmonary disease, unspecified: Secondary | ICD-10-CM | POA: Diagnosis not present

## 2015-04-10 DIAGNOSIS — I10 Essential (primary) hypertension: Secondary | ICD-10-CM | POA: Diagnosis not present

## 2015-04-10 DIAGNOSIS — I4891 Unspecified atrial fibrillation: Secondary | ICD-10-CM | POA: Diagnosis not present

## 2015-04-17 DIAGNOSIS — J029 Acute pharyngitis, unspecified: Secondary | ICD-10-CM | POA: Diagnosis not present

## 2015-04-17 DIAGNOSIS — J449 Chronic obstructive pulmonary disease, unspecified: Secondary | ICD-10-CM | POA: Diagnosis not present

## 2015-04-17 DIAGNOSIS — J3489 Other specified disorders of nose and nasal sinuses: Secondary | ICD-10-CM | POA: Diagnosis not present

## 2015-04-17 DIAGNOSIS — R54 Age-related physical debility: Secondary | ICD-10-CM | POA: Diagnosis not present

## 2015-04-17 DIAGNOSIS — R05 Cough: Secondary | ICD-10-CM | POA: Diagnosis not present

## 2015-04-29 DIAGNOSIS — H401131 Primary open-angle glaucoma, bilateral, mild stage: Secondary | ICD-10-CM | POA: Diagnosis not present

## 2015-05-22 DIAGNOSIS — R05 Cough: Secondary | ICD-10-CM | POA: Diagnosis not present

## 2015-05-22 DIAGNOSIS — J449 Chronic obstructive pulmonary disease, unspecified: Secondary | ICD-10-CM | POA: Diagnosis not present

## 2015-05-22 DIAGNOSIS — R54 Age-related physical debility: Secondary | ICD-10-CM | POA: Diagnosis not present

## 2015-05-22 DIAGNOSIS — K5900 Constipation, unspecified: Secondary | ICD-10-CM | POA: Diagnosis not present

## 2015-06-03 DIAGNOSIS — B9620 Unspecified Escherichia coli [E. coli] as the cause of diseases classified elsewhere: Secondary | ICD-10-CM | POA: Diagnosis present

## 2015-06-03 DIAGNOSIS — J4541 Moderate persistent asthma with (acute) exacerbation: Secondary | ICD-10-CM | POA: Diagnosis not present

## 2015-06-03 DIAGNOSIS — B965 Pseudomonas (aeruginosa) (mallei) (pseudomallei) as the cause of diseases classified elsewhere: Secondary | ICD-10-CM | POA: Diagnosis present

## 2015-06-03 DIAGNOSIS — R531 Weakness: Secondary | ICD-10-CM | POA: Diagnosis not present

## 2015-06-03 DIAGNOSIS — H409 Unspecified glaucoma: Secondary | ICD-10-CM | POA: Diagnosis present

## 2015-06-03 DIAGNOSIS — I10 Essential (primary) hypertension: Secondary | ICD-10-CM | POA: Diagnosis not present

## 2015-06-03 DIAGNOSIS — Z87891 Personal history of nicotine dependence: Secondary | ICD-10-CM | POA: Diagnosis not present

## 2015-06-03 DIAGNOSIS — R079 Chest pain, unspecified: Secondary | ICD-10-CM | POA: Diagnosis not present

## 2015-06-03 DIAGNOSIS — R402411 Glasgow coma scale score 13-15, in the field [EMT or ambulance]: Secondary | ICD-10-CM | POA: Diagnosis not present

## 2015-06-03 DIAGNOSIS — R0602 Shortness of breath: Secondary | ICD-10-CM | POA: Diagnosis not present

## 2015-06-03 DIAGNOSIS — I5031 Acute diastolic (congestive) heart failure: Secondary | ICD-10-CM | POA: Diagnosis not present

## 2015-06-03 DIAGNOSIS — E876 Hypokalemia: Secondary | ICD-10-CM | POA: Diagnosis present

## 2015-06-03 DIAGNOSIS — J988 Other specified respiratory disorders: Secondary | ICD-10-CM | POA: Diagnosis present

## 2015-06-03 DIAGNOSIS — Z22322 Carrier or suspected carrier of Methicillin resistant Staphylococcus aureus: Secondary | ICD-10-CM | POA: Diagnosis not present

## 2015-06-03 DIAGNOSIS — N390 Urinary tract infection, site not specified: Secondary | ICD-10-CM | POA: Diagnosis not present

## 2015-06-03 DIAGNOSIS — Z7901 Long term (current) use of anticoagulants: Secondary | ICD-10-CM | POA: Diagnosis not present

## 2015-06-03 DIAGNOSIS — Z1623 Resistance to quinolones and fluoroquinolones: Secondary | ICD-10-CM | POA: Diagnosis present

## 2015-06-03 DIAGNOSIS — R110 Nausea: Secondary | ICD-10-CM | POA: Diagnosis not present

## 2015-06-03 DIAGNOSIS — R9431 Abnormal electrocardiogram [ECG] [EKG]: Secondary | ICD-10-CM | POA: Diagnosis present

## 2015-06-03 DIAGNOSIS — I110 Hypertensive heart disease with heart failure: Secondary | ICD-10-CM | POA: Diagnosis present

## 2015-06-03 DIAGNOSIS — E7800 Pure hypercholesterolemia, unspecified: Secondary | ICD-10-CM | POA: Diagnosis present

## 2015-06-03 DIAGNOSIS — R269 Unspecified abnormalities of gait and mobility: Secondary | ICD-10-CM | POA: Diagnosis present

## 2015-06-03 DIAGNOSIS — I4891 Unspecified atrial fibrillation: Secondary | ICD-10-CM | POA: Diagnosis present

## 2015-06-03 DIAGNOSIS — J449 Chronic obstructive pulmonary disease, unspecified: Secondary | ICD-10-CM | POA: Diagnosis not present

## 2015-06-03 DIAGNOSIS — Z993 Dependence on wheelchair: Secondary | ICD-10-CM | POA: Diagnosis not present

## 2015-06-03 DIAGNOSIS — E871 Hypo-osmolality and hyponatremia: Secondary | ICD-10-CM | POA: Diagnosis not present

## 2015-06-03 DIAGNOSIS — Z79899 Other long term (current) drug therapy: Secondary | ICD-10-CM | POA: Diagnosis not present

## 2015-07-03 DIAGNOSIS — E039 Hypothyroidism, unspecified: Secondary | ICD-10-CM | POA: Diagnosis not present

## 2015-07-03 DIAGNOSIS — I4891 Unspecified atrial fibrillation: Secondary | ICD-10-CM | POA: Diagnosis not present

## 2015-07-03 DIAGNOSIS — I10 Essential (primary) hypertension: Secondary | ICD-10-CM | POA: Diagnosis not present

## 2015-07-03 DIAGNOSIS — J449 Chronic obstructive pulmonary disease, unspecified: Secondary | ICD-10-CM | POA: Diagnosis not present

## 2015-07-29 DIAGNOSIS — H401131 Primary open-angle glaucoma, bilateral, mild stage: Secondary | ICD-10-CM | POA: Diagnosis not present

## 2015-07-29 DIAGNOSIS — H2512 Age-related nuclear cataract, left eye: Secondary | ICD-10-CM | POA: Diagnosis not present

## 2015-08-10 DIAGNOSIS — I480 Paroxysmal atrial fibrillation: Secondary | ICD-10-CM | POA: Diagnosis not present

## 2015-08-10 DIAGNOSIS — Z79899 Other long term (current) drug therapy: Secondary | ICD-10-CM | POA: Diagnosis not present

## 2015-08-10 DIAGNOSIS — J309 Allergic rhinitis, unspecified: Secondary | ICD-10-CM | POA: Diagnosis present

## 2015-08-10 DIAGNOSIS — I5030 Unspecified diastolic (congestive) heart failure: Secondary | ICD-10-CM | POA: Diagnosis not present

## 2015-08-10 DIAGNOSIS — T45511A Poisoning by anticoagulants, accidental (unintentional), initial encounter: Secondary | ICD-10-CM | POA: Diagnosis not present

## 2015-08-10 DIAGNOSIS — E7800 Pure hypercholesterolemia, unspecified: Secondary | ICD-10-CM | POA: Diagnosis present

## 2015-08-10 DIAGNOSIS — K5730 Diverticulosis of large intestine without perforation or abscess without bleeding: Secondary | ICD-10-CM | POA: Diagnosis not present

## 2015-08-10 DIAGNOSIS — K648 Other hemorrhoids: Secondary | ICD-10-CM | POA: Diagnosis not present

## 2015-08-10 DIAGNOSIS — T45515A Adverse effect of anticoagulants, initial encounter: Secondary | ICD-10-CM | POA: Diagnosis present

## 2015-08-10 DIAGNOSIS — E785 Hyperlipidemia, unspecified: Secondary | ICD-10-CM | POA: Diagnosis not present

## 2015-08-10 DIAGNOSIS — I10 Essential (primary) hypertension: Secondary | ICD-10-CM | POA: Diagnosis not present

## 2015-08-10 DIAGNOSIS — I5031 Acute diastolic (congestive) heart failure: Secondary | ICD-10-CM | POA: Diagnosis not present

## 2015-08-10 DIAGNOSIS — I4891 Unspecified atrial fibrillation: Secondary | ICD-10-CM | POA: Diagnosis not present

## 2015-08-10 DIAGNOSIS — Z87891 Personal history of nicotine dependence: Secondary | ICD-10-CM | POA: Diagnosis not present

## 2015-08-10 DIAGNOSIS — K625 Hemorrhage of anus and rectum: Secondary | ICD-10-CM | POA: Diagnosis not present

## 2015-08-10 DIAGNOSIS — K5732 Diverticulitis of large intestine without perforation or abscess without bleeding: Secondary | ICD-10-CM | POA: Diagnosis not present

## 2015-08-10 DIAGNOSIS — D62 Acute posthemorrhagic anemia: Secondary | ICD-10-CM | POA: Diagnosis not present

## 2015-08-10 DIAGNOSIS — H409 Unspecified glaucoma: Secondary | ICD-10-CM | POA: Diagnosis present

## 2015-08-10 DIAGNOSIS — R05 Cough: Secondary | ICD-10-CM | POA: Diagnosis not present

## 2015-08-10 DIAGNOSIS — Z7901 Long term (current) use of anticoagulants: Secondary | ICD-10-CM | POA: Diagnosis not present

## 2015-08-10 DIAGNOSIS — K921 Melena: Secondary | ICD-10-CM | POA: Diagnosis present

## 2015-08-10 DIAGNOSIS — I110 Hypertensive heart disease with heart failure: Secondary | ICD-10-CM | POA: Diagnosis present

## 2015-08-10 DIAGNOSIS — J449 Chronic obstructive pulmonary disease, unspecified: Secondary | ICD-10-CM | POA: Diagnosis present

## 2015-08-10 DIAGNOSIS — D128 Benign neoplasm of rectum: Secondary | ICD-10-CM | POA: Diagnosis not present

## 2015-08-10 DIAGNOSIS — K922 Gastrointestinal hemorrhage, unspecified: Secondary | ICD-10-CM | POA: Diagnosis not present

## 2015-08-10 DIAGNOSIS — K621 Rectal polyp: Secondary | ICD-10-CM | POA: Diagnosis not present

## 2015-08-10 DIAGNOSIS — M810 Age-related osteoporosis without current pathological fracture: Secondary | ICD-10-CM | POA: Diagnosis present

## 2015-08-10 DIAGNOSIS — R791 Abnormal coagulation profile: Secondary | ICD-10-CM | POA: Diagnosis present

## 2015-08-11 DIAGNOSIS — J449 Chronic obstructive pulmonary disease, unspecified: Secondary | ICD-10-CM

## 2015-08-11 DIAGNOSIS — I10 Essential (primary) hypertension: Secondary | ICD-10-CM

## 2015-08-11 DIAGNOSIS — E785 Hyperlipidemia, unspecified: Secondary | ICD-10-CM

## 2015-08-11 DIAGNOSIS — K922 Gastrointestinal hemorrhage, unspecified: Secondary | ICD-10-CM

## 2015-08-12 DIAGNOSIS — K621 Rectal polyp: Secondary | ICD-10-CM | POA: Diagnosis not present

## 2015-08-12 DIAGNOSIS — K648 Other hemorrhoids: Secondary | ICD-10-CM | POA: Diagnosis not present

## 2015-08-12 DIAGNOSIS — K5730 Diverticulosis of large intestine without perforation or abscess without bleeding: Secondary | ICD-10-CM | POA: Diagnosis not present

## 2015-08-21 DIAGNOSIS — K922 Gastrointestinal hemorrhage, unspecified: Secondary | ICD-10-CM | POA: Diagnosis not present

## 2015-08-21 DIAGNOSIS — I4891 Unspecified atrial fibrillation: Secondary | ICD-10-CM | POA: Diagnosis not present

## 2015-08-21 DIAGNOSIS — K635 Polyp of colon: Secondary | ICD-10-CM | POA: Diagnosis not present

## 2015-08-21 DIAGNOSIS — H6120 Impacted cerumen, unspecified ear: Secondary | ICD-10-CM | POA: Diagnosis not present

## 2015-09-18 DIAGNOSIS — I4891 Unspecified atrial fibrillation: Secondary | ICD-10-CM | POA: Diagnosis not present

## 2015-09-18 DIAGNOSIS — J449 Chronic obstructive pulmonary disease, unspecified: Secondary | ICD-10-CM | POA: Diagnosis not present

## 2015-09-18 DIAGNOSIS — E039 Hypothyroidism, unspecified: Secondary | ICD-10-CM | POA: Diagnosis not present

## 2015-09-18 DIAGNOSIS — S80812A Abrasion, left lower leg, initial encounter: Secondary | ICD-10-CM | POA: Diagnosis not present

## 2015-09-23 DIAGNOSIS — D649 Anemia, unspecified: Secondary | ICD-10-CM | POA: Diagnosis not present

## 2015-09-23 DIAGNOSIS — R7309 Other abnormal glucose: Secondary | ICD-10-CM | POA: Diagnosis not present

## 2015-09-23 DIAGNOSIS — E039 Hypothyroidism, unspecified: Secondary | ICD-10-CM | POA: Diagnosis not present

## 2015-09-23 DIAGNOSIS — E785 Hyperlipidemia, unspecified: Secondary | ICD-10-CM | POA: Diagnosis not present

## 2015-09-23 DIAGNOSIS — R7989 Other specified abnormal findings of blood chemistry: Secondary | ICD-10-CM | POA: Diagnosis not present

## 2015-09-23 DIAGNOSIS — E559 Vitamin D deficiency, unspecified: Secondary | ICD-10-CM | POA: Diagnosis not present

## 2015-10-28 DIAGNOSIS — H401131 Primary open-angle glaucoma, bilateral, mild stage: Secondary | ICD-10-CM | POA: Diagnosis not present

## 2015-12-09 DIAGNOSIS — Z23 Encounter for immunization: Secondary | ICD-10-CM | POA: Diagnosis not present

## 2015-12-18 DIAGNOSIS — K219 Gastro-esophageal reflux disease without esophagitis: Secondary | ICD-10-CM | POA: Diagnosis not present

## 2015-12-18 DIAGNOSIS — I4891 Unspecified atrial fibrillation: Secondary | ICD-10-CM | POA: Diagnosis not present

## 2015-12-18 DIAGNOSIS — I5030 Unspecified diastolic (congestive) heart failure: Secondary | ICD-10-CM | POA: Diagnosis not present

## 2015-12-18 DIAGNOSIS — J449 Chronic obstructive pulmonary disease, unspecified: Secondary | ICD-10-CM | POA: Diagnosis not present

## 2016-01-28 DIAGNOSIS — H401131 Primary open-angle glaucoma, bilateral, mild stage: Secondary | ICD-10-CM | POA: Diagnosis not present

## 2016-03-18 DIAGNOSIS — J9610 Chronic respiratory failure, unspecified whether with hypoxia or hypercapnia: Secondary | ICD-10-CM | POA: Diagnosis not present

## 2016-03-18 DIAGNOSIS — R062 Wheezing: Secondary | ICD-10-CM | POA: Diagnosis not present

## 2016-03-18 DIAGNOSIS — I4891 Unspecified atrial fibrillation: Secondary | ICD-10-CM | POA: Diagnosis not present

## 2016-03-18 DIAGNOSIS — J449 Chronic obstructive pulmonary disease, unspecified: Secondary | ICD-10-CM | POA: Diagnosis not present

## 2016-03-18 DIAGNOSIS — R05 Cough: Secondary | ICD-10-CM | POA: Diagnosis not present

## 2016-03-22 DIAGNOSIS — I10 Essential (primary) hypertension: Secondary | ICD-10-CM | POA: Diagnosis not present

## 2016-03-22 DIAGNOSIS — D649 Anemia, unspecified: Secondary | ICD-10-CM | POA: Diagnosis not present

## 2016-04-28 DIAGNOSIS — H401131 Primary open-angle glaucoma, bilateral, mild stage: Secondary | ICD-10-CM | POA: Diagnosis not present

## 2016-05-20 DIAGNOSIS — S80811A Abrasion, right lower leg, initial encounter: Secondary | ICD-10-CM | POA: Diagnosis not present

## 2016-05-20 DIAGNOSIS — K1370 Unspecified lesions of oral mucosa: Secondary | ICD-10-CM | POA: Diagnosis not present

## 2016-05-20 DIAGNOSIS — R54 Age-related physical debility: Secondary | ICD-10-CM | POA: Diagnosis not present

## 2016-06-10 DIAGNOSIS — H9190 Unspecified hearing loss, unspecified ear: Secondary | ICD-10-CM | POA: Diagnosis not present

## 2016-06-10 DIAGNOSIS — H6123 Impacted cerumen, bilateral: Secondary | ICD-10-CM | POA: Diagnosis not present

## 2016-06-17 DIAGNOSIS — Z6825 Body mass index (BMI) 25.0-25.9, adult: Secondary | ICD-10-CM | POA: Diagnosis not present

## 2016-06-17 DIAGNOSIS — I4891 Unspecified atrial fibrillation: Secondary | ICD-10-CM | POA: Diagnosis not present

## 2016-06-17 DIAGNOSIS — H9193 Unspecified hearing loss, bilateral: Secondary | ICD-10-CM | POA: Diagnosis not present

## 2016-06-17 DIAGNOSIS — H6123 Impacted cerumen, bilateral: Secondary | ICD-10-CM | POA: Diagnosis not present

## 2016-06-17 DIAGNOSIS — J449 Chronic obstructive pulmonary disease, unspecified: Secondary | ICD-10-CM | POA: Diagnosis not present

## 2016-06-17 DIAGNOSIS — I5030 Unspecified diastolic (congestive) heart failure: Secondary | ICD-10-CM | POA: Diagnosis not present

## 2016-06-17 DIAGNOSIS — I10 Essential (primary) hypertension: Secondary | ICD-10-CM | POA: Diagnosis not present

## 2016-06-18 DIAGNOSIS — E039 Hypothyroidism, unspecified: Secondary | ICD-10-CM | POA: Diagnosis not present

## 2016-06-18 DIAGNOSIS — E119 Type 2 diabetes mellitus without complications: Secondary | ICD-10-CM | POA: Diagnosis not present

## 2016-06-21 DIAGNOSIS — E119 Type 2 diabetes mellitus without complications: Secondary | ICD-10-CM | POA: Diagnosis not present

## 2016-06-21 DIAGNOSIS — E039 Hypothyroidism, unspecified: Secondary | ICD-10-CM | POA: Diagnosis not present

## 2016-06-24 DIAGNOSIS — L03116 Cellulitis of left lower limb: Secondary | ICD-10-CM | POA: Diagnosis not present

## 2016-06-24 DIAGNOSIS — S80812A Abrasion, left lower leg, initial encounter: Secondary | ICD-10-CM | POA: Diagnosis not present

## 2016-06-24 DIAGNOSIS — R54 Age-related physical debility: Secondary | ICD-10-CM | POA: Diagnosis not present

## 2016-06-24 DIAGNOSIS — R600 Localized edema: Secondary | ICD-10-CM | POA: Diagnosis not present

## 2016-07-28 DIAGNOSIS — H401131 Primary open-angle glaucoma, bilateral, mild stage: Secondary | ICD-10-CM | POA: Diagnosis not present

## 2016-08-05 DIAGNOSIS — S9032XA Contusion of left foot, initial encounter: Secondary | ICD-10-CM | POA: Diagnosis not present

## 2016-08-05 DIAGNOSIS — L03116 Cellulitis of left lower limb: Secondary | ICD-10-CM | POA: Diagnosis not present

## 2016-08-05 DIAGNOSIS — M79672 Pain in left foot: Secondary | ICD-10-CM | POA: Diagnosis not present

## 2016-08-05 DIAGNOSIS — S90812A Abrasion, left foot, initial encounter: Secondary | ICD-10-CM | POA: Diagnosis not present

## 2016-08-12 DIAGNOSIS — R05 Cough: Secondary | ICD-10-CM | POA: Diagnosis not present

## 2016-08-12 DIAGNOSIS — J449 Chronic obstructive pulmonary disease, unspecified: Secondary | ICD-10-CM | POA: Diagnosis not present

## 2016-08-12 DIAGNOSIS — J9610 Chronic respiratory failure, unspecified whether with hypoxia or hypercapnia: Secondary | ICD-10-CM | POA: Diagnosis not present

## 2016-08-17 DIAGNOSIS — Z7901 Long term (current) use of anticoagulants: Secondary | ICD-10-CM | POA: Diagnosis not present

## 2016-10-19 DIAGNOSIS — M25512 Pain in left shoulder: Secondary | ICD-10-CM | POA: Diagnosis not present

## 2016-10-19 DIAGNOSIS — M1990 Unspecified osteoarthritis, unspecified site: Secondary | ICD-10-CM | POA: Diagnosis not present

## 2016-10-19 DIAGNOSIS — R54 Age-related physical debility: Secondary | ICD-10-CM | POA: Diagnosis not present

## 2016-10-20 DIAGNOSIS — N390 Urinary tract infection, site not specified: Secondary | ICD-10-CM | POA: Diagnosis not present

## 2016-10-23 DIAGNOSIS — M25512 Pain in left shoulder: Secondary | ICD-10-CM | POA: Diagnosis not present

## 2016-10-26 DIAGNOSIS — R1030 Lower abdominal pain, unspecified: Secondary | ICD-10-CM | POA: Diagnosis not present

## 2016-10-26 DIAGNOSIS — R54 Age-related physical debility: Secondary | ICD-10-CM | POA: Diagnosis not present

## 2016-10-26 DIAGNOSIS — R300 Dysuria: Secondary | ICD-10-CM | POA: Diagnosis not present

## 2016-10-26 DIAGNOSIS — N390 Urinary tract infection, site not specified: Secondary | ICD-10-CM | POA: Diagnosis not present

## 2016-10-28 DIAGNOSIS — H401131 Primary open-angle glaucoma, bilateral, mild stage: Secondary | ICD-10-CM | POA: Diagnosis not present

## 2016-10-29 DIAGNOSIS — J9610 Chronic respiratory failure, unspecified whether with hypoxia or hypercapnia: Secondary | ICD-10-CM | POA: Diagnosis not present

## 2016-10-29 DIAGNOSIS — M25512 Pain in left shoulder: Secondary | ICD-10-CM | POA: Diagnosis not present

## 2016-10-29 DIAGNOSIS — J449 Chronic obstructive pulmonary disease, unspecified: Secondary | ICD-10-CM | POA: Diagnosis not present

## 2016-10-29 DIAGNOSIS — R54 Age-related physical debility: Secondary | ICD-10-CM | POA: Diagnosis not present

## 2016-11-01 DIAGNOSIS — J9610 Chronic respiratory failure, unspecified whether with hypoxia or hypercapnia: Secondary | ICD-10-CM | POA: Diagnosis not present

## 2016-11-01 DIAGNOSIS — R54 Age-related physical debility: Secondary | ICD-10-CM | POA: Diagnosis not present

## 2016-11-01 DIAGNOSIS — J449 Chronic obstructive pulmonary disease, unspecified: Secondary | ICD-10-CM | POA: Diagnosis not present

## 2016-11-01 DIAGNOSIS — M25512 Pain in left shoulder: Secondary | ICD-10-CM | POA: Diagnosis not present

## 2016-11-03 DIAGNOSIS — J9610 Chronic respiratory failure, unspecified whether with hypoxia or hypercapnia: Secondary | ICD-10-CM | POA: Diagnosis not present

## 2016-11-03 DIAGNOSIS — J449 Chronic obstructive pulmonary disease, unspecified: Secondary | ICD-10-CM | POA: Diagnosis not present

## 2016-11-03 DIAGNOSIS — M25512 Pain in left shoulder: Secondary | ICD-10-CM | POA: Diagnosis not present

## 2016-11-03 DIAGNOSIS — R54 Age-related physical debility: Secondary | ICD-10-CM | POA: Diagnosis not present

## 2016-11-09 DIAGNOSIS — J449 Chronic obstructive pulmonary disease, unspecified: Secondary | ICD-10-CM | POA: Diagnosis not present

## 2016-11-09 DIAGNOSIS — R54 Age-related physical debility: Secondary | ICD-10-CM | POA: Diagnosis not present

## 2016-11-09 DIAGNOSIS — M25512 Pain in left shoulder: Secondary | ICD-10-CM | POA: Diagnosis not present

## 2016-11-09 DIAGNOSIS — J9610 Chronic respiratory failure, unspecified whether with hypoxia or hypercapnia: Secondary | ICD-10-CM | POA: Diagnosis not present

## 2016-11-11 DIAGNOSIS — J449 Chronic obstructive pulmonary disease, unspecified: Secondary | ICD-10-CM | POA: Diagnosis not present

## 2016-11-11 DIAGNOSIS — J9610 Chronic respiratory failure, unspecified whether with hypoxia or hypercapnia: Secondary | ICD-10-CM | POA: Diagnosis not present

## 2016-11-11 DIAGNOSIS — M25512 Pain in left shoulder: Secondary | ICD-10-CM | POA: Diagnosis not present

## 2016-11-11 DIAGNOSIS — R54 Age-related physical debility: Secondary | ICD-10-CM | POA: Diagnosis not present

## 2016-11-16 DIAGNOSIS — M25512 Pain in left shoulder: Secondary | ICD-10-CM | POA: Diagnosis not present

## 2016-11-16 DIAGNOSIS — J449 Chronic obstructive pulmonary disease, unspecified: Secondary | ICD-10-CM | POA: Diagnosis not present

## 2016-11-16 DIAGNOSIS — R54 Age-related physical debility: Secondary | ICD-10-CM | POA: Diagnosis not present

## 2016-11-16 DIAGNOSIS — J9610 Chronic respiratory failure, unspecified whether with hypoxia or hypercapnia: Secondary | ICD-10-CM | POA: Diagnosis not present

## 2016-11-18 DIAGNOSIS — J449 Chronic obstructive pulmonary disease, unspecified: Secondary | ICD-10-CM | POA: Diagnosis not present

## 2016-11-18 DIAGNOSIS — M25512 Pain in left shoulder: Secondary | ICD-10-CM | POA: Diagnosis not present

## 2016-11-18 DIAGNOSIS — J9610 Chronic respiratory failure, unspecified whether with hypoxia or hypercapnia: Secondary | ICD-10-CM | POA: Diagnosis not present

## 2016-11-18 DIAGNOSIS — R54 Age-related physical debility: Secondary | ICD-10-CM | POA: Diagnosis not present

## 2016-11-22 DIAGNOSIS — R54 Age-related physical debility: Secondary | ICD-10-CM | POA: Diagnosis not present

## 2016-11-22 DIAGNOSIS — J449 Chronic obstructive pulmonary disease, unspecified: Secondary | ICD-10-CM | POA: Diagnosis not present

## 2016-11-22 DIAGNOSIS — M25512 Pain in left shoulder: Secondary | ICD-10-CM | POA: Diagnosis not present

## 2016-11-22 DIAGNOSIS — J9610 Chronic respiratory failure, unspecified whether with hypoxia or hypercapnia: Secondary | ICD-10-CM | POA: Diagnosis not present

## 2016-11-24 DIAGNOSIS — M25512 Pain in left shoulder: Secondary | ICD-10-CM | POA: Diagnosis not present

## 2016-11-24 DIAGNOSIS — E785 Hyperlipidemia, unspecified: Secondary | ICD-10-CM | POA: Diagnosis not present

## 2016-11-24 DIAGNOSIS — J9610 Chronic respiratory failure, unspecified whether with hypoxia or hypercapnia: Secondary | ICD-10-CM | POA: Diagnosis not present

## 2016-11-24 DIAGNOSIS — M810 Age-related osteoporosis without current pathological fracture: Secondary | ICD-10-CM | POA: Diagnosis not present

## 2016-11-24 DIAGNOSIS — I509 Heart failure, unspecified: Secondary | ICD-10-CM | POA: Diagnosis not present

## 2016-11-24 DIAGNOSIS — R54 Age-related physical debility: Secondary | ICD-10-CM | POA: Diagnosis not present

## 2016-11-24 DIAGNOSIS — J449 Chronic obstructive pulmonary disease, unspecified: Secondary | ICD-10-CM | POA: Diagnosis not present

## 2016-11-24 DIAGNOSIS — I4891 Unspecified atrial fibrillation: Secondary | ICD-10-CM | POA: Diagnosis not present

## 2016-11-24 DIAGNOSIS — E039 Hypothyroidism, unspecified: Secondary | ICD-10-CM | POA: Diagnosis not present

## 2016-11-24 DIAGNOSIS — I10 Essential (primary) hypertension: Secondary | ICD-10-CM | POA: Diagnosis not present

## 2016-11-26 DIAGNOSIS — I10 Essential (primary) hypertension: Secondary | ICD-10-CM | POA: Diagnosis not present

## 2016-11-26 DIAGNOSIS — D649 Anemia, unspecified: Secondary | ICD-10-CM | POA: Diagnosis not present

## 2016-11-26 DIAGNOSIS — E119 Type 2 diabetes mellitus without complications: Secondary | ICD-10-CM | POA: Diagnosis not present

## 2016-12-02 DIAGNOSIS — Z23 Encounter for immunization: Secondary | ICD-10-CM | POA: Diagnosis not present

## 2017-01-31 DIAGNOSIS — H401131 Primary open-angle glaucoma, bilateral, mild stage: Secondary | ICD-10-CM | POA: Diagnosis not present

## 2017-02-01 DIAGNOSIS — R05 Cough: Secondary | ICD-10-CM | POA: Diagnosis not present

## 2017-02-01 DIAGNOSIS — R0989 Other specified symptoms and signs involving the circulatory and respiratory systems: Secondary | ICD-10-CM | POA: Diagnosis not present

## 2017-02-01 DIAGNOSIS — R54 Age-related physical debility: Secondary | ICD-10-CM | POA: Diagnosis not present

## 2017-03-03 DIAGNOSIS — J449 Chronic obstructive pulmonary disease, unspecified: Secondary | ICD-10-CM | POA: Diagnosis not present

## 2017-03-03 DIAGNOSIS — R05 Cough: Secondary | ICD-10-CM | POA: Diagnosis not present

## 2017-03-03 DIAGNOSIS — I10 Essential (primary) hypertension: Secondary | ICD-10-CM | POA: Diagnosis not present

## 2017-03-03 DIAGNOSIS — H6123 Impacted cerumen, bilateral: Secondary | ICD-10-CM | POA: Diagnosis not present

## 2017-03-03 DIAGNOSIS — R062 Wheezing: Secondary | ICD-10-CM | POA: Diagnosis not present

## 2017-03-03 DIAGNOSIS — M79605 Pain in left leg: Secondary | ICD-10-CM | POA: Diagnosis not present

## 2017-03-03 DIAGNOSIS — R5381 Other malaise: Secondary | ICD-10-CM | POA: Diagnosis not present

## 2017-03-04 DIAGNOSIS — R0989 Other specified symptoms and signs involving the circulatory and respiratory systems: Secondary | ICD-10-CM | POA: Diagnosis not present

## 2017-03-04 DIAGNOSIS — R05 Cough: Secondary | ICD-10-CM | POA: Diagnosis not present

## 2017-03-06 DIAGNOSIS — I509 Heart failure, unspecified: Secondary | ICD-10-CM | POA: Diagnosis not present

## 2017-03-06 DIAGNOSIS — Z87891 Personal history of nicotine dependence: Secondary | ICD-10-CM | POA: Diagnosis not present

## 2017-03-06 DIAGNOSIS — J449 Chronic obstructive pulmonary disease, unspecified: Secondary | ICD-10-CM | POA: Diagnosis not present

## 2017-03-06 DIAGNOSIS — R443 Hallucinations, unspecified: Secondary | ICD-10-CM | POA: Diagnosis not present

## 2017-03-06 DIAGNOSIS — J9811 Atelectasis: Secondary | ICD-10-CM | POA: Diagnosis not present

## 2017-03-06 DIAGNOSIS — I110 Hypertensive heart disease with heart failure: Secondary | ICD-10-CM | POA: Diagnosis not present

## 2017-03-06 DIAGNOSIS — F29 Unspecified psychosis not due to a substance or known physiological condition: Secondary | ICD-10-CM | POA: Diagnosis not present

## 2017-03-06 DIAGNOSIS — R4182 Altered mental status, unspecified: Secondary | ICD-10-CM | POA: Diagnosis not present

## 2017-03-06 DIAGNOSIS — R410 Disorientation, unspecified: Secondary | ICD-10-CM | POA: Diagnosis not present

## 2017-03-06 DIAGNOSIS — E7800 Pure hypercholesterolemia, unspecified: Secondary | ICD-10-CM | POA: Diagnosis not present

## 2017-03-06 DIAGNOSIS — I4891 Unspecified atrial fibrillation: Secondary | ICD-10-CM | POA: Diagnosis not present

## 2017-03-09 DIAGNOSIS — E039 Hypothyroidism, unspecified: Secondary | ICD-10-CM | POA: Diagnosis not present

## 2017-03-09 DIAGNOSIS — E871 Hypo-osmolality and hyponatremia: Secondary | ICD-10-CM | POA: Diagnosis not present

## 2017-03-09 DIAGNOSIS — J449 Chronic obstructive pulmonary disease, unspecified: Secondary | ICD-10-CM | POA: Diagnosis not present

## 2017-03-09 DIAGNOSIS — D649 Anemia, unspecified: Secondary | ICD-10-CM | POA: Diagnosis not present

## 2017-03-09 DIAGNOSIS — J9600 Acute respiratory failure, unspecified whether with hypoxia or hypercapnia: Secondary | ICD-10-CM | POA: Diagnosis not present

## 2017-03-09 DIAGNOSIS — R0609 Other forms of dyspnea: Secondary | ICD-10-CM | POA: Diagnosis not present

## 2017-03-09 DIAGNOSIS — E785 Hyperlipidemia, unspecified: Secondary | ICD-10-CM | POA: Diagnosis not present

## 2017-03-09 DIAGNOSIS — E559 Vitamin D deficiency, unspecified: Secondary | ICD-10-CM | POA: Diagnosis not present

## 2017-03-09 DIAGNOSIS — E119 Type 2 diabetes mellitus without complications: Secondary | ICD-10-CM | POA: Diagnosis not present

## 2017-03-09 DIAGNOSIS — M25562 Pain in left knee: Secondary | ICD-10-CM | POA: Diagnosis not present

## 2017-03-10 DIAGNOSIS — M25562 Pain in left knee: Secondary | ICD-10-CM | POA: Diagnosis not present

## 2017-03-10 DIAGNOSIS — R062 Wheezing: Secondary | ICD-10-CM | POA: Diagnosis not present

## 2017-03-10 DIAGNOSIS — I509 Heart failure, unspecified: Secondary | ICD-10-CM | POA: Diagnosis not present

## 2017-03-10 DIAGNOSIS — M1990 Unspecified osteoarthritis, unspecified site: Secondary | ICD-10-CM | POA: Diagnosis not present

## 2017-03-10 DIAGNOSIS — R269 Unspecified abnormalities of gait and mobility: Secondary | ICD-10-CM | POA: Diagnosis not present

## 2017-03-10 DIAGNOSIS — M810 Age-related osteoporosis without current pathological fracture: Secondary | ICD-10-CM | POA: Diagnosis not present

## 2017-03-10 DIAGNOSIS — J189 Pneumonia, unspecified organism: Secondary | ICD-10-CM | POA: Diagnosis not present

## 2017-03-14 DIAGNOSIS — I10 Essential (primary) hypertension: Secondary | ICD-10-CM | POA: Diagnosis not present

## 2017-04-02 DIAGNOSIS — R4781 Slurred speech: Secondary | ICD-10-CM | POA: Diagnosis not present

## 2017-04-02 DIAGNOSIS — Z23 Encounter for immunization: Secondary | ICD-10-CM | POA: Diagnosis not present

## 2017-04-02 DIAGNOSIS — R3129 Other microscopic hematuria: Secondary | ICD-10-CM | POA: Diagnosis not present

## 2017-04-02 DIAGNOSIS — S52225A Nondisplaced transverse fracture of shaft of left ulna, initial encounter for closed fracture: Secondary | ICD-10-CM | POA: Diagnosis not present

## 2017-04-02 DIAGNOSIS — S0990XA Unspecified injury of head, initial encounter: Secondary | ICD-10-CM | POA: Diagnosis not present

## 2017-04-02 DIAGNOSIS — S60812A Abrasion of left wrist, initial encounter: Secondary | ICD-10-CM | POA: Diagnosis not present

## 2017-04-02 DIAGNOSIS — S8002XA Contusion of left knee, initial encounter: Secondary | ICD-10-CM | POA: Diagnosis not present

## 2017-04-02 DIAGNOSIS — M25532 Pain in left wrist: Secondary | ICD-10-CM | POA: Diagnosis not present

## 2017-04-02 DIAGNOSIS — T148XXA Other injury of unspecified body region, initial encounter: Secondary | ICD-10-CM | POA: Diagnosis not present

## 2017-04-02 DIAGNOSIS — S8990XA Unspecified injury of unspecified lower leg, initial encounter: Secondary | ICD-10-CM | POA: Diagnosis not present

## 2017-04-02 DIAGNOSIS — Z87891 Personal history of nicotine dependence: Secondary | ICD-10-CM | POA: Diagnosis not present

## 2017-04-02 DIAGNOSIS — S6992XA Unspecified injury of left wrist, hand and finger(s), initial encounter: Secondary | ICD-10-CM | POA: Diagnosis not present

## 2017-04-04 DIAGNOSIS — M6281 Muscle weakness (generalized): Secondary | ICD-10-CM | POA: Diagnosis not present

## 2017-04-04 DIAGNOSIS — S8002XA Contusion of left knee, initial encounter: Secondary | ICD-10-CM | POA: Diagnosis not present

## 2017-04-04 DIAGNOSIS — R319 Hematuria, unspecified: Secondary | ICD-10-CM | POA: Diagnosis not present

## 2017-04-04 DIAGNOSIS — W19XXXA Unspecified fall, initial encounter: Secondary | ICD-10-CM | POA: Diagnosis not present

## 2017-04-04 DIAGNOSIS — S5012XA Contusion of left forearm, initial encounter: Secondary | ICD-10-CM | POA: Diagnosis not present

## 2017-04-04 DIAGNOSIS — S52602S Unspecified fracture of lower end of left ulna, sequela: Secondary | ICD-10-CM | POA: Diagnosis not present

## 2017-04-04 DIAGNOSIS — S40811A Abrasion of right upper arm, initial encounter: Secondary | ICD-10-CM | POA: Diagnosis not present

## 2017-04-04 DIAGNOSIS — R269 Unspecified abnormalities of gait and mobility: Secondary | ICD-10-CM | POA: Diagnosis not present

## 2017-04-05 DIAGNOSIS — N952 Postmenopausal atrophic vaginitis: Secondary | ICD-10-CM | POA: Diagnosis not present

## 2017-04-05 DIAGNOSIS — N8111 Cystocele, midline: Secondary | ICD-10-CM | POA: Diagnosis not present

## 2017-04-05 DIAGNOSIS — R32 Unspecified urinary incontinence: Secondary | ICD-10-CM | POA: Diagnosis not present

## 2017-04-05 DIAGNOSIS — N390 Urinary tract infection, site not specified: Secondary | ICD-10-CM | POA: Diagnosis not present

## 2017-04-05 DIAGNOSIS — B373 Candidiasis of vulva and vagina: Secondary | ICD-10-CM | POA: Diagnosis not present

## 2017-04-05 DIAGNOSIS — Z79899 Other long term (current) drug therapy: Secondary | ICD-10-CM | POA: Diagnosis not present

## 2017-04-05 DIAGNOSIS — R3129 Other microscopic hematuria: Secondary | ICD-10-CM | POA: Diagnosis not present

## 2017-04-06 DIAGNOSIS — J9610 Chronic respiratory failure, unspecified whether with hypoxia or hypercapnia: Secondary | ICD-10-CM | POA: Diagnosis not present

## 2017-04-06 DIAGNOSIS — M810 Age-related osteoporosis without current pathological fracture: Secondary | ICD-10-CM | POA: Diagnosis not present

## 2017-04-06 DIAGNOSIS — I10 Essential (primary) hypertension: Secondary | ICD-10-CM | POA: Diagnosis not present

## 2017-04-06 DIAGNOSIS — E785 Hyperlipidemia, unspecified: Secondary | ICD-10-CM | POA: Diagnosis not present

## 2017-04-06 DIAGNOSIS — S52225A Nondisplaced transverse fracture of shaft of left ulna, initial encounter for closed fracture: Secondary | ICD-10-CM | POA: Diagnosis not present

## 2017-04-06 DIAGNOSIS — Z9181 History of falling: Secondary | ICD-10-CM | POA: Diagnosis not present

## 2017-04-06 DIAGNOSIS — I4891 Unspecified atrial fibrillation: Secondary | ICD-10-CM | POA: Diagnosis not present

## 2017-04-06 DIAGNOSIS — J449 Chronic obstructive pulmonary disease, unspecified: Secondary | ICD-10-CM | POA: Diagnosis not present

## 2017-04-06 DIAGNOSIS — S52602K Unspecified fracture of lower end of left ulna, subsequent encounter for closed fracture with nonunion: Secondary | ICD-10-CM | POA: Diagnosis not present

## 2017-04-06 DIAGNOSIS — H409 Unspecified glaucoma: Secondary | ICD-10-CM | POA: Diagnosis not present

## 2017-04-06 DIAGNOSIS — W19XXXA Unspecified fall, initial encounter: Secondary | ICD-10-CM | POA: Diagnosis not present

## 2017-04-06 DIAGNOSIS — E039 Hypothyroidism, unspecified: Secondary | ICD-10-CM | POA: Diagnosis not present

## 2017-04-06 DIAGNOSIS — I509 Heart failure, unspecified: Secondary | ICD-10-CM | POA: Diagnosis not present

## 2017-04-08 DIAGNOSIS — I4891 Unspecified atrial fibrillation: Secondary | ICD-10-CM | POA: Diagnosis not present

## 2017-04-08 DIAGNOSIS — J449 Chronic obstructive pulmonary disease, unspecified: Secondary | ICD-10-CM | POA: Diagnosis not present

## 2017-04-08 DIAGNOSIS — S52225A Nondisplaced transverse fracture of shaft of left ulna, initial encounter for closed fracture: Secondary | ICD-10-CM | POA: Diagnosis not present

## 2017-04-08 DIAGNOSIS — I509 Heart failure, unspecified: Secondary | ICD-10-CM | POA: Diagnosis not present

## 2017-04-08 DIAGNOSIS — H409 Unspecified glaucoma: Secondary | ICD-10-CM | POA: Diagnosis not present

## 2017-04-08 DIAGNOSIS — I10 Essential (primary) hypertension: Secondary | ICD-10-CM | POA: Diagnosis not present

## 2017-04-09 DIAGNOSIS — S52225A Nondisplaced transverse fracture of shaft of left ulna, initial encounter for closed fracture: Secondary | ICD-10-CM | POA: Diagnosis not present

## 2017-04-09 DIAGNOSIS — I509 Heart failure, unspecified: Secondary | ICD-10-CM | POA: Diagnosis not present

## 2017-04-09 DIAGNOSIS — I4891 Unspecified atrial fibrillation: Secondary | ICD-10-CM | POA: Diagnosis not present

## 2017-04-09 DIAGNOSIS — I10 Essential (primary) hypertension: Secondary | ICD-10-CM | POA: Diagnosis not present

## 2017-04-09 DIAGNOSIS — J449 Chronic obstructive pulmonary disease, unspecified: Secondary | ICD-10-CM | POA: Diagnosis not present

## 2017-04-09 DIAGNOSIS — H409 Unspecified glaucoma: Secondary | ICD-10-CM | POA: Diagnosis not present

## 2017-04-11 DIAGNOSIS — J449 Chronic obstructive pulmonary disease, unspecified: Secondary | ICD-10-CM | POA: Diagnosis not present

## 2017-04-11 DIAGNOSIS — H409 Unspecified glaucoma: Secondary | ICD-10-CM | POA: Diagnosis not present

## 2017-04-11 DIAGNOSIS — I509 Heart failure, unspecified: Secondary | ICD-10-CM | POA: Diagnosis not present

## 2017-04-11 DIAGNOSIS — S52225A Nondisplaced transverse fracture of shaft of left ulna, initial encounter for closed fracture: Secondary | ICD-10-CM | POA: Diagnosis not present

## 2017-04-11 DIAGNOSIS — I4891 Unspecified atrial fibrillation: Secondary | ICD-10-CM | POA: Diagnosis not present

## 2017-04-11 DIAGNOSIS — I10 Essential (primary) hypertension: Secondary | ICD-10-CM | POA: Diagnosis not present

## 2017-04-12 DIAGNOSIS — S52225A Nondisplaced transverse fracture of shaft of left ulna, initial encounter for closed fracture: Secondary | ICD-10-CM | POA: Diagnosis not present

## 2017-04-12 DIAGNOSIS — I10 Essential (primary) hypertension: Secondary | ICD-10-CM | POA: Diagnosis not present

## 2017-04-12 DIAGNOSIS — J449 Chronic obstructive pulmonary disease, unspecified: Secondary | ICD-10-CM | POA: Diagnosis not present

## 2017-04-12 DIAGNOSIS — H409 Unspecified glaucoma: Secondary | ICD-10-CM | POA: Diagnosis not present

## 2017-04-12 DIAGNOSIS — I509 Heart failure, unspecified: Secondary | ICD-10-CM | POA: Diagnosis not present

## 2017-04-12 DIAGNOSIS — I4891 Unspecified atrial fibrillation: Secondary | ICD-10-CM | POA: Diagnosis not present

## 2017-04-14 DIAGNOSIS — S52225A Nondisplaced transverse fracture of shaft of left ulna, initial encounter for closed fracture: Secondary | ICD-10-CM | POA: Diagnosis not present

## 2017-04-14 DIAGNOSIS — I10 Essential (primary) hypertension: Secondary | ICD-10-CM | POA: Diagnosis not present

## 2017-04-14 DIAGNOSIS — I4891 Unspecified atrial fibrillation: Secondary | ICD-10-CM | POA: Diagnosis not present

## 2017-04-14 DIAGNOSIS — J449 Chronic obstructive pulmonary disease, unspecified: Secondary | ICD-10-CM | POA: Diagnosis not present

## 2017-04-14 DIAGNOSIS — H409 Unspecified glaucoma: Secondary | ICD-10-CM | POA: Diagnosis not present

## 2017-04-14 DIAGNOSIS — I509 Heart failure, unspecified: Secondary | ICD-10-CM | POA: Diagnosis not present

## 2017-04-15 DIAGNOSIS — H409 Unspecified glaucoma: Secondary | ICD-10-CM | POA: Diagnosis not present

## 2017-04-15 DIAGNOSIS — I509 Heart failure, unspecified: Secondary | ICD-10-CM | POA: Diagnosis not present

## 2017-04-15 DIAGNOSIS — J449 Chronic obstructive pulmonary disease, unspecified: Secondary | ICD-10-CM | POA: Diagnosis not present

## 2017-04-15 DIAGNOSIS — S52225A Nondisplaced transverse fracture of shaft of left ulna, initial encounter for closed fracture: Secondary | ICD-10-CM | POA: Diagnosis not present

## 2017-04-15 DIAGNOSIS — I4891 Unspecified atrial fibrillation: Secondary | ICD-10-CM | POA: Diagnosis not present

## 2017-04-15 DIAGNOSIS — I10 Essential (primary) hypertension: Secondary | ICD-10-CM | POA: Diagnosis not present

## 2017-04-18 DIAGNOSIS — I10 Essential (primary) hypertension: Secondary | ICD-10-CM | POA: Diagnosis not present

## 2017-04-18 DIAGNOSIS — I4891 Unspecified atrial fibrillation: Secondary | ICD-10-CM | POA: Diagnosis not present

## 2017-04-18 DIAGNOSIS — H409 Unspecified glaucoma: Secondary | ICD-10-CM | POA: Diagnosis not present

## 2017-04-18 DIAGNOSIS — I509 Heart failure, unspecified: Secondary | ICD-10-CM | POA: Diagnosis not present

## 2017-04-18 DIAGNOSIS — J449 Chronic obstructive pulmonary disease, unspecified: Secondary | ICD-10-CM | POA: Diagnosis not present

## 2017-04-18 DIAGNOSIS — S52225A Nondisplaced transverse fracture of shaft of left ulna, initial encounter for closed fracture: Secondary | ICD-10-CM | POA: Diagnosis not present

## 2017-04-19 DIAGNOSIS — H409 Unspecified glaucoma: Secondary | ICD-10-CM | POA: Diagnosis not present

## 2017-04-19 DIAGNOSIS — I10 Essential (primary) hypertension: Secondary | ICD-10-CM | POA: Diagnosis not present

## 2017-04-19 DIAGNOSIS — J449 Chronic obstructive pulmonary disease, unspecified: Secondary | ICD-10-CM | POA: Diagnosis not present

## 2017-04-19 DIAGNOSIS — I509 Heart failure, unspecified: Secondary | ICD-10-CM | POA: Diagnosis not present

## 2017-04-19 DIAGNOSIS — I4891 Unspecified atrial fibrillation: Secondary | ICD-10-CM | POA: Diagnosis not present

## 2017-04-19 DIAGNOSIS — S52225A Nondisplaced transverse fracture of shaft of left ulna, initial encounter for closed fracture: Secondary | ICD-10-CM | POA: Diagnosis not present

## 2017-04-20 DIAGNOSIS — I4891 Unspecified atrial fibrillation: Secondary | ICD-10-CM | POA: Diagnosis not present

## 2017-04-20 DIAGNOSIS — I10 Essential (primary) hypertension: Secondary | ICD-10-CM | POA: Diagnosis not present

## 2017-04-20 DIAGNOSIS — S52225A Nondisplaced transverse fracture of shaft of left ulna, initial encounter for closed fracture: Secondary | ICD-10-CM | POA: Diagnosis not present

## 2017-04-20 DIAGNOSIS — H409 Unspecified glaucoma: Secondary | ICD-10-CM | POA: Diagnosis not present

## 2017-04-20 DIAGNOSIS — I509 Heart failure, unspecified: Secondary | ICD-10-CM | POA: Diagnosis not present

## 2017-04-20 DIAGNOSIS — J449 Chronic obstructive pulmonary disease, unspecified: Secondary | ICD-10-CM | POA: Diagnosis not present

## 2017-04-21 DIAGNOSIS — H409 Unspecified glaucoma: Secondary | ICD-10-CM | POA: Diagnosis not present

## 2017-04-21 DIAGNOSIS — I4891 Unspecified atrial fibrillation: Secondary | ICD-10-CM | POA: Diagnosis not present

## 2017-04-21 DIAGNOSIS — S52225A Nondisplaced transverse fracture of shaft of left ulna, initial encounter for closed fracture: Secondary | ICD-10-CM | POA: Diagnosis not present

## 2017-04-21 DIAGNOSIS — I10 Essential (primary) hypertension: Secondary | ICD-10-CM | POA: Diagnosis not present

## 2017-04-21 DIAGNOSIS — J449 Chronic obstructive pulmonary disease, unspecified: Secondary | ICD-10-CM | POA: Diagnosis not present

## 2017-04-21 DIAGNOSIS — S52602K Unspecified fracture of lower end of left ulna, subsequent encounter for closed fracture with nonunion: Secondary | ICD-10-CM | POA: Diagnosis not present

## 2017-04-21 DIAGNOSIS — I509 Heart failure, unspecified: Secondary | ICD-10-CM | POA: Diagnosis not present

## 2017-04-26 DIAGNOSIS — S52225A Nondisplaced transverse fracture of shaft of left ulna, initial encounter for closed fracture: Secondary | ICD-10-CM | POA: Diagnosis not present

## 2017-04-26 DIAGNOSIS — H409 Unspecified glaucoma: Secondary | ICD-10-CM | POA: Diagnosis not present

## 2017-04-26 DIAGNOSIS — I509 Heart failure, unspecified: Secondary | ICD-10-CM | POA: Diagnosis not present

## 2017-04-26 DIAGNOSIS — I4891 Unspecified atrial fibrillation: Secondary | ICD-10-CM | POA: Diagnosis not present

## 2017-04-26 DIAGNOSIS — J449 Chronic obstructive pulmonary disease, unspecified: Secondary | ICD-10-CM | POA: Diagnosis not present

## 2017-04-26 DIAGNOSIS — I10 Essential (primary) hypertension: Secondary | ICD-10-CM | POA: Diagnosis not present

## 2017-04-27 DIAGNOSIS — I509 Heart failure, unspecified: Secondary | ICD-10-CM | POA: Diagnosis not present

## 2017-04-27 DIAGNOSIS — S52225A Nondisplaced transverse fracture of shaft of left ulna, initial encounter for closed fracture: Secondary | ICD-10-CM | POA: Diagnosis not present

## 2017-04-27 DIAGNOSIS — I4891 Unspecified atrial fibrillation: Secondary | ICD-10-CM | POA: Diagnosis not present

## 2017-04-27 DIAGNOSIS — J449 Chronic obstructive pulmonary disease, unspecified: Secondary | ICD-10-CM | POA: Diagnosis not present

## 2017-04-27 DIAGNOSIS — H409 Unspecified glaucoma: Secondary | ICD-10-CM | POA: Diagnosis not present

## 2017-04-27 DIAGNOSIS — I10 Essential (primary) hypertension: Secondary | ICD-10-CM | POA: Diagnosis not present

## 2017-04-28 DIAGNOSIS — S52225A Nondisplaced transverse fracture of shaft of left ulna, initial encounter for closed fracture: Secondary | ICD-10-CM | POA: Diagnosis not present

## 2017-04-28 DIAGNOSIS — I4891 Unspecified atrial fibrillation: Secondary | ICD-10-CM | POA: Diagnosis not present

## 2017-04-28 DIAGNOSIS — I509 Heart failure, unspecified: Secondary | ICD-10-CM | POA: Diagnosis not present

## 2017-04-28 DIAGNOSIS — H409 Unspecified glaucoma: Secondary | ICD-10-CM | POA: Diagnosis not present

## 2017-04-28 DIAGNOSIS — J449 Chronic obstructive pulmonary disease, unspecified: Secondary | ICD-10-CM | POA: Diagnosis not present

## 2017-04-28 DIAGNOSIS — I10 Essential (primary) hypertension: Secondary | ICD-10-CM | POA: Diagnosis not present

## 2017-05-03 DIAGNOSIS — B373 Candidiasis of vulva and vagina: Secondary | ICD-10-CM | POA: Diagnosis not present

## 2017-05-03 DIAGNOSIS — N390 Urinary tract infection, site not specified: Secondary | ICD-10-CM | POA: Diagnosis not present

## 2017-05-03 DIAGNOSIS — R3129 Other microscopic hematuria: Secondary | ICD-10-CM | POA: Diagnosis not present

## 2017-05-04 DIAGNOSIS — I4891 Unspecified atrial fibrillation: Secondary | ICD-10-CM | POA: Diagnosis not present

## 2017-05-04 DIAGNOSIS — H401131 Primary open-angle glaucoma, bilateral, mild stage: Secondary | ICD-10-CM | POA: Diagnosis not present

## 2017-05-04 DIAGNOSIS — I509 Heart failure, unspecified: Secondary | ICD-10-CM | POA: Diagnosis not present

## 2017-05-04 DIAGNOSIS — S52225A Nondisplaced transverse fracture of shaft of left ulna, initial encounter for closed fracture: Secondary | ICD-10-CM | POA: Diagnosis not present

## 2017-05-04 DIAGNOSIS — I10 Essential (primary) hypertension: Secondary | ICD-10-CM | POA: Diagnosis not present

## 2017-05-04 DIAGNOSIS — H409 Unspecified glaucoma: Secondary | ICD-10-CM | POA: Diagnosis not present

## 2017-05-04 DIAGNOSIS — J449 Chronic obstructive pulmonary disease, unspecified: Secondary | ICD-10-CM | POA: Diagnosis not present

## 2017-05-05 DIAGNOSIS — I4891 Unspecified atrial fibrillation: Secondary | ICD-10-CM | POA: Diagnosis not present

## 2017-05-05 DIAGNOSIS — I509 Heart failure, unspecified: Secondary | ICD-10-CM | POA: Diagnosis not present

## 2017-05-05 DIAGNOSIS — I10 Essential (primary) hypertension: Secondary | ICD-10-CM | POA: Diagnosis not present

## 2017-05-05 DIAGNOSIS — J449 Chronic obstructive pulmonary disease, unspecified: Secondary | ICD-10-CM | POA: Diagnosis not present

## 2017-05-05 DIAGNOSIS — S52225A Nondisplaced transverse fracture of shaft of left ulna, initial encounter for closed fracture: Secondary | ICD-10-CM | POA: Diagnosis not present

## 2017-05-05 DIAGNOSIS — H409 Unspecified glaucoma: Secondary | ICD-10-CM | POA: Diagnosis not present

## 2017-05-09 DIAGNOSIS — R3129 Other microscopic hematuria: Secondary | ICD-10-CM | POA: Diagnosis not present

## 2017-05-12 DIAGNOSIS — S52225A Nondisplaced transverse fracture of shaft of left ulna, initial encounter for closed fracture: Secondary | ICD-10-CM | POA: Diagnosis not present

## 2017-05-12 DIAGNOSIS — I509 Heart failure, unspecified: Secondary | ICD-10-CM | POA: Diagnosis not present

## 2017-05-12 DIAGNOSIS — I10 Essential (primary) hypertension: Secondary | ICD-10-CM | POA: Diagnosis not present

## 2017-05-12 DIAGNOSIS — J449 Chronic obstructive pulmonary disease, unspecified: Secondary | ICD-10-CM | POA: Diagnosis not present

## 2017-05-12 DIAGNOSIS — I4891 Unspecified atrial fibrillation: Secondary | ICD-10-CM | POA: Diagnosis not present

## 2017-05-12 DIAGNOSIS — H409 Unspecified glaucoma: Secondary | ICD-10-CM | POA: Diagnosis not present

## 2017-05-13 DIAGNOSIS — H409 Unspecified glaucoma: Secondary | ICD-10-CM | POA: Diagnosis not present

## 2017-05-13 DIAGNOSIS — I10 Essential (primary) hypertension: Secondary | ICD-10-CM | POA: Diagnosis not present

## 2017-05-13 DIAGNOSIS — S52225A Nondisplaced transverse fracture of shaft of left ulna, initial encounter for closed fracture: Secondary | ICD-10-CM | POA: Diagnosis not present

## 2017-05-13 DIAGNOSIS — J449 Chronic obstructive pulmonary disease, unspecified: Secondary | ICD-10-CM | POA: Diagnosis not present

## 2017-05-13 DIAGNOSIS — I4891 Unspecified atrial fibrillation: Secondary | ICD-10-CM | POA: Diagnosis not present

## 2017-05-13 DIAGNOSIS — I509 Heart failure, unspecified: Secondary | ICD-10-CM | POA: Diagnosis not present

## 2017-05-18 DIAGNOSIS — I509 Heart failure, unspecified: Secondary | ICD-10-CM | POA: Diagnosis not present

## 2017-05-18 DIAGNOSIS — I4891 Unspecified atrial fibrillation: Secondary | ICD-10-CM | POA: Diagnosis not present

## 2017-05-18 DIAGNOSIS — J449 Chronic obstructive pulmonary disease, unspecified: Secondary | ICD-10-CM | POA: Diagnosis not present

## 2017-05-18 DIAGNOSIS — H409 Unspecified glaucoma: Secondary | ICD-10-CM | POA: Diagnosis not present

## 2017-05-18 DIAGNOSIS — S52225A Nondisplaced transverse fracture of shaft of left ulna, initial encounter for closed fracture: Secondary | ICD-10-CM | POA: Diagnosis not present

## 2017-05-18 DIAGNOSIS — I10 Essential (primary) hypertension: Secondary | ICD-10-CM | POA: Diagnosis not present

## 2017-05-24 DIAGNOSIS — S52602K Unspecified fracture of lower end of left ulna, subsequent encounter for closed fracture with nonunion: Secondary | ICD-10-CM | POA: Diagnosis not present

## 2017-05-25 DIAGNOSIS — I509 Heart failure, unspecified: Secondary | ICD-10-CM | POA: Diagnosis not present

## 2017-05-25 DIAGNOSIS — I4891 Unspecified atrial fibrillation: Secondary | ICD-10-CM | POA: Diagnosis not present

## 2017-05-25 DIAGNOSIS — H409 Unspecified glaucoma: Secondary | ICD-10-CM | POA: Diagnosis not present

## 2017-05-25 DIAGNOSIS — I10 Essential (primary) hypertension: Secondary | ICD-10-CM | POA: Diagnosis not present

## 2017-05-25 DIAGNOSIS — J449 Chronic obstructive pulmonary disease, unspecified: Secondary | ICD-10-CM | POA: Diagnosis not present

## 2017-05-25 DIAGNOSIS — S52225A Nondisplaced transverse fracture of shaft of left ulna, initial encounter for closed fracture: Secondary | ICD-10-CM | POA: Diagnosis not present

## 2017-05-30 DIAGNOSIS — H409 Unspecified glaucoma: Secondary | ICD-10-CM | POA: Diagnosis not present

## 2017-05-30 DIAGNOSIS — J449 Chronic obstructive pulmonary disease, unspecified: Secondary | ICD-10-CM | POA: Diagnosis not present

## 2017-05-30 DIAGNOSIS — S52225A Nondisplaced transverse fracture of shaft of left ulna, initial encounter for closed fracture: Secondary | ICD-10-CM | POA: Diagnosis not present

## 2017-05-30 DIAGNOSIS — I10 Essential (primary) hypertension: Secondary | ICD-10-CM | POA: Diagnosis not present

## 2017-05-30 DIAGNOSIS — I509 Heart failure, unspecified: Secondary | ICD-10-CM | POA: Diagnosis not present

## 2017-05-30 DIAGNOSIS — I4891 Unspecified atrial fibrillation: Secondary | ICD-10-CM | POA: Diagnosis not present

## 2017-05-31 DIAGNOSIS — R3129 Other microscopic hematuria: Secondary | ICD-10-CM | POA: Diagnosis not present

## 2017-05-31 DIAGNOSIS — N2889 Other specified disorders of kidney and ureter: Secondary | ICD-10-CM | POA: Diagnosis not present

## 2017-05-31 DIAGNOSIS — N390 Urinary tract infection, site not specified: Secondary | ICD-10-CM | POA: Diagnosis not present

## 2017-06-03 DIAGNOSIS — I509 Heart failure, unspecified: Secondary | ICD-10-CM | POA: Diagnosis not present

## 2017-06-03 DIAGNOSIS — J449 Chronic obstructive pulmonary disease, unspecified: Secondary | ICD-10-CM | POA: Diagnosis not present

## 2017-06-03 DIAGNOSIS — S52225A Nondisplaced transverse fracture of shaft of left ulna, initial encounter for closed fracture: Secondary | ICD-10-CM | POA: Diagnosis not present

## 2017-06-03 DIAGNOSIS — I10 Essential (primary) hypertension: Secondary | ICD-10-CM | POA: Diagnosis not present

## 2017-06-03 DIAGNOSIS — H409 Unspecified glaucoma: Secondary | ICD-10-CM | POA: Diagnosis not present

## 2017-06-03 DIAGNOSIS — I4891 Unspecified atrial fibrillation: Secondary | ICD-10-CM | POA: Diagnosis not present

## 2017-06-04 DIAGNOSIS — J449 Chronic obstructive pulmonary disease, unspecified: Secondary | ICD-10-CM | POA: Diagnosis not present

## 2017-06-04 DIAGNOSIS — I509 Heart failure, unspecified: Secondary | ICD-10-CM | POA: Diagnosis not present

## 2017-06-04 DIAGNOSIS — S52225A Nondisplaced transverse fracture of shaft of left ulna, initial encounter for closed fracture: Secondary | ICD-10-CM | POA: Diagnosis not present

## 2017-06-04 DIAGNOSIS — I4891 Unspecified atrial fibrillation: Secondary | ICD-10-CM | POA: Diagnosis not present

## 2017-06-04 DIAGNOSIS — I10 Essential (primary) hypertension: Secondary | ICD-10-CM | POA: Diagnosis not present

## 2017-06-04 DIAGNOSIS — H409 Unspecified glaucoma: Secondary | ICD-10-CM | POA: Diagnosis not present

## 2017-06-05 DIAGNOSIS — I10 Essential (primary) hypertension: Secondary | ICD-10-CM | POA: Diagnosis not present

## 2017-06-05 DIAGNOSIS — E039 Hypothyroidism, unspecified: Secondary | ICD-10-CM | POA: Diagnosis not present

## 2017-06-05 DIAGNOSIS — E785 Hyperlipidemia, unspecified: Secondary | ICD-10-CM | POA: Diagnosis not present

## 2017-06-05 DIAGNOSIS — J449 Chronic obstructive pulmonary disease, unspecified: Secondary | ICD-10-CM | POA: Diagnosis not present

## 2017-06-05 DIAGNOSIS — H409 Unspecified glaucoma: Secondary | ICD-10-CM | POA: Diagnosis not present

## 2017-06-05 DIAGNOSIS — N390 Urinary tract infection, site not specified: Secondary | ICD-10-CM | POA: Diagnosis not present

## 2017-06-05 DIAGNOSIS — I509 Heart failure, unspecified: Secondary | ICD-10-CM | POA: Diagnosis not present

## 2017-06-05 DIAGNOSIS — M810 Age-related osteoporosis without current pathological fracture: Secondary | ICD-10-CM | POA: Diagnosis not present

## 2017-06-05 DIAGNOSIS — I4891 Unspecified atrial fibrillation: Secondary | ICD-10-CM | POA: Diagnosis not present

## 2017-06-05 DIAGNOSIS — J9610 Chronic respiratory failure, unspecified whether with hypoxia or hypercapnia: Secondary | ICD-10-CM | POA: Diagnosis not present

## 2017-06-05 DIAGNOSIS — Z9181 History of falling: Secondary | ICD-10-CM | POA: Diagnosis not present

## 2017-06-06 DIAGNOSIS — I509 Heart failure, unspecified: Secondary | ICD-10-CM | POA: Diagnosis not present

## 2017-06-06 DIAGNOSIS — I4891 Unspecified atrial fibrillation: Secondary | ICD-10-CM | POA: Diagnosis not present

## 2017-06-06 DIAGNOSIS — I10 Essential (primary) hypertension: Secondary | ICD-10-CM | POA: Diagnosis not present

## 2017-06-06 DIAGNOSIS — H409 Unspecified glaucoma: Secondary | ICD-10-CM | POA: Diagnosis not present

## 2017-06-06 DIAGNOSIS — N390 Urinary tract infection, site not specified: Secondary | ICD-10-CM | POA: Diagnosis not present

## 2017-06-06 DIAGNOSIS — J449 Chronic obstructive pulmonary disease, unspecified: Secondary | ICD-10-CM | POA: Diagnosis not present

## 2017-06-07 DIAGNOSIS — I509 Heart failure, unspecified: Secondary | ICD-10-CM | POA: Diagnosis not present

## 2017-06-07 DIAGNOSIS — H409 Unspecified glaucoma: Secondary | ICD-10-CM | POA: Diagnosis not present

## 2017-06-07 DIAGNOSIS — N390 Urinary tract infection, site not specified: Secondary | ICD-10-CM | POA: Diagnosis not present

## 2017-06-07 DIAGNOSIS — J449 Chronic obstructive pulmonary disease, unspecified: Secondary | ICD-10-CM | POA: Diagnosis not present

## 2017-06-07 DIAGNOSIS — I4891 Unspecified atrial fibrillation: Secondary | ICD-10-CM | POA: Diagnosis not present

## 2017-06-07 DIAGNOSIS — I10 Essential (primary) hypertension: Secondary | ICD-10-CM | POA: Diagnosis not present

## 2017-06-13 DIAGNOSIS — I509 Heart failure, unspecified: Secondary | ICD-10-CM | POA: Diagnosis not present

## 2017-06-13 DIAGNOSIS — I4891 Unspecified atrial fibrillation: Secondary | ICD-10-CM | POA: Diagnosis not present

## 2017-06-13 DIAGNOSIS — H409 Unspecified glaucoma: Secondary | ICD-10-CM | POA: Diagnosis not present

## 2017-06-13 DIAGNOSIS — I10 Essential (primary) hypertension: Secondary | ICD-10-CM | POA: Diagnosis not present

## 2017-06-13 DIAGNOSIS — J449 Chronic obstructive pulmonary disease, unspecified: Secondary | ICD-10-CM | POA: Diagnosis not present

## 2017-06-13 DIAGNOSIS — N390 Urinary tract infection, site not specified: Secondary | ICD-10-CM | POA: Diagnosis not present

## 2017-06-14 DIAGNOSIS — I4891 Unspecified atrial fibrillation: Secondary | ICD-10-CM | POA: Diagnosis not present

## 2017-06-14 DIAGNOSIS — I129 Hypertensive chronic kidney disease with stage 1 through stage 4 chronic kidney disease, or unspecified chronic kidney disease: Secondary | ICD-10-CM | POA: Diagnosis not present

## 2017-06-14 DIAGNOSIS — N390 Urinary tract infection, site not specified: Secondary | ICD-10-CM | POA: Diagnosis not present

## 2017-06-14 DIAGNOSIS — N2889 Other specified disorders of kidney and ureter: Secondary | ICD-10-CM | POA: Diagnosis not present

## 2017-06-14 DIAGNOSIS — N183 Chronic kidney disease, stage 3 (moderate): Secondary | ICD-10-CM | POA: Diagnosis not present

## 2017-06-14 DIAGNOSIS — Z6826 Body mass index (BMI) 26.0-26.9, adult: Secondary | ICD-10-CM | POA: Diagnosis not present

## 2017-06-14 DIAGNOSIS — J449 Chronic obstructive pulmonary disease, unspecified: Secondary | ICD-10-CM | POA: Diagnosis not present

## 2017-06-15 DIAGNOSIS — R3129 Other microscopic hematuria: Secondary | ICD-10-CM | POA: Diagnosis not present

## 2017-06-15 DIAGNOSIS — N3090 Cystitis, unspecified without hematuria: Secondary | ICD-10-CM | POA: Diagnosis not present

## 2017-06-15 DIAGNOSIS — N952 Postmenopausal atrophic vaginitis: Secondary | ICD-10-CM | POA: Diagnosis not present

## 2017-06-22 DIAGNOSIS — S52602K Unspecified fracture of lower end of left ulna, subsequent encounter for closed fracture with nonunion: Secondary | ICD-10-CM | POA: Diagnosis not present

## 2017-07-02 DIAGNOSIS — H6122 Impacted cerumen, left ear: Secondary | ICD-10-CM | POA: Diagnosis not present

## 2017-07-02 DIAGNOSIS — H6123 Impacted cerumen, bilateral: Secondary | ICD-10-CM | POA: Diagnosis not present

## 2017-07-02 DIAGNOSIS — H6121 Impacted cerumen, right ear: Secondary | ICD-10-CM | POA: Diagnosis not present

## 2017-07-07 DIAGNOSIS — J9610 Chronic respiratory failure, unspecified whether with hypoxia or hypercapnia: Secondary | ICD-10-CM | POA: Diagnosis not present

## 2017-07-07 DIAGNOSIS — J449 Chronic obstructive pulmonary disease, unspecified: Secondary | ICD-10-CM | POA: Diagnosis not present

## 2017-07-07 DIAGNOSIS — R0989 Other specified symptoms and signs involving the circulatory and respiratory systems: Secondary | ICD-10-CM | POA: Diagnosis not present

## 2017-07-07 DIAGNOSIS — R05 Cough: Secondary | ICD-10-CM | POA: Diagnosis not present

## 2017-07-07 DIAGNOSIS — R062 Wheezing: Secondary | ICD-10-CM | POA: Diagnosis not present

## 2017-07-08 DIAGNOSIS — R5381 Other malaise: Secondary | ICD-10-CM | POA: Diagnosis not present

## 2017-07-08 DIAGNOSIS — R54 Age-related physical debility: Secondary | ICD-10-CM | POA: Diagnosis not present

## 2017-07-08 DIAGNOSIS — R05 Cough: Secondary | ICD-10-CM | POA: Diagnosis not present

## 2017-07-08 DIAGNOSIS — J189 Pneumonia, unspecified organism: Secondary | ICD-10-CM | POA: Diagnosis not present

## 2017-07-08 DIAGNOSIS — J449 Chronic obstructive pulmonary disease, unspecified: Secondary | ICD-10-CM | POA: Diagnosis not present

## 2017-07-08 DIAGNOSIS — R0989 Other specified symptoms and signs involving the circulatory and respiratory systems: Secondary | ICD-10-CM | POA: Diagnosis not present

## 2017-07-12 DIAGNOSIS — J189 Pneumonia, unspecified organism: Secondary | ICD-10-CM | POA: Diagnosis not present

## 2017-07-12 DIAGNOSIS — R54 Age-related physical debility: Secondary | ICD-10-CM | POA: Diagnosis not present

## 2017-07-12 DIAGNOSIS — J449 Chronic obstructive pulmonary disease, unspecified: Secondary | ICD-10-CM | POA: Diagnosis not present

## 2017-08-02 DIAGNOSIS — N390 Urinary tract infection, site not specified: Secondary | ICD-10-CM | POA: Diagnosis not present

## 2017-08-02 DIAGNOSIS — J449 Chronic obstructive pulmonary disease, unspecified: Secondary | ICD-10-CM | POA: Diagnosis not present

## 2017-08-02 DIAGNOSIS — H409 Unspecified glaucoma: Secondary | ICD-10-CM | POA: Diagnosis not present

## 2017-08-02 DIAGNOSIS — I509 Heart failure, unspecified: Secondary | ICD-10-CM | POA: Diagnosis not present

## 2017-08-02 DIAGNOSIS — I10 Essential (primary) hypertension: Secondary | ICD-10-CM | POA: Diagnosis not present

## 2017-08-02 DIAGNOSIS — I4891 Unspecified atrial fibrillation: Secondary | ICD-10-CM | POA: Diagnosis not present

## 2017-08-03 DIAGNOSIS — H401131 Primary open-angle glaucoma, bilateral, mild stage: Secondary | ICD-10-CM | POA: Diagnosis not present

## 2017-08-05 DIAGNOSIS — S50311A Abrasion of right elbow, initial encounter: Secondary | ICD-10-CM | POA: Diagnosis not present

## 2017-08-10 DIAGNOSIS — N3090 Cystitis, unspecified without hematuria: Secondary | ICD-10-CM | POA: Diagnosis not present

## 2017-08-10 DIAGNOSIS — N281 Cyst of kidney, acquired: Secondary | ICD-10-CM | POA: Diagnosis not present

## 2017-08-10 DIAGNOSIS — R3129 Other microscopic hematuria: Secondary | ICD-10-CM | POA: Diagnosis not present

## 2017-08-31 DIAGNOSIS — N6082 Other benign mammary dysplasias of left breast: Secondary | ICD-10-CM | POA: Diagnosis not present

## 2017-09-01 DIAGNOSIS — R635 Abnormal weight gain: Secondary | ICD-10-CM | POA: Diagnosis not present

## 2017-09-01 DIAGNOSIS — I509 Heart failure, unspecified: Secondary | ICD-10-CM | POA: Diagnosis not present

## 2017-09-01 DIAGNOSIS — R54 Age-related physical debility: Secondary | ICD-10-CM | POA: Diagnosis not present

## 2017-09-07 DIAGNOSIS — J449 Chronic obstructive pulmonary disease, unspecified: Secondary | ICD-10-CM | POA: Diagnosis not present

## 2017-09-07 DIAGNOSIS — R05 Cough: Secondary | ICD-10-CM | POA: Diagnosis not present

## 2017-09-07 DIAGNOSIS — L570 Actinic keratosis: Secondary | ICD-10-CM | POA: Diagnosis not present

## 2017-09-12 DIAGNOSIS — L989 Disorder of the skin and subcutaneous tissue, unspecified: Secondary | ICD-10-CM | POA: Diagnosis not present

## 2017-10-26 DIAGNOSIS — Z87440 Personal history of urinary (tract) infections: Secondary | ICD-10-CM | POA: Diagnosis not present

## 2017-10-26 DIAGNOSIS — K219 Gastro-esophageal reflux disease without esophagitis: Secondary | ICD-10-CM | POA: Diagnosis not present

## 2017-10-26 DIAGNOSIS — I509 Heart failure, unspecified: Secondary | ICD-10-CM | POA: Diagnosis not present

## 2017-10-26 DIAGNOSIS — E785 Hyperlipidemia, unspecified: Secondary | ICD-10-CM | POA: Diagnosis not present

## 2017-10-26 DIAGNOSIS — M810 Age-related osteoporosis without current pathological fracture: Secondary | ICD-10-CM | POA: Diagnosis not present

## 2017-10-26 DIAGNOSIS — J449 Chronic obstructive pulmonary disease, unspecified: Secondary | ICD-10-CM | POA: Diagnosis not present

## 2017-10-26 DIAGNOSIS — J310 Chronic rhinitis: Secondary | ICD-10-CM | POA: Diagnosis not present

## 2017-10-26 DIAGNOSIS — I482 Chronic atrial fibrillation: Secondary | ICD-10-CM | POA: Diagnosis not present

## 2017-10-26 DIAGNOSIS — I10 Essential (primary) hypertension: Secondary | ICD-10-CM | POA: Diagnosis not present

## 2017-10-27 DIAGNOSIS — I10 Essential (primary) hypertension: Secondary | ICD-10-CM | POA: Diagnosis not present

## 2017-10-27 DIAGNOSIS — D649 Anemia, unspecified: Secondary | ICD-10-CM | POA: Diagnosis not present

## 2017-10-27 DIAGNOSIS — E785 Hyperlipidemia, unspecified: Secondary | ICD-10-CM | POA: Diagnosis not present

## 2017-10-27 DIAGNOSIS — E119 Type 2 diabetes mellitus without complications: Secondary | ICD-10-CM | POA: Diagnosis not present

## 2017-11-08 DIAGNOSIS — H401131 Primary open-angle glaucoma, bilateral, mild stage: Secondary | ICD-10-CM | POA: Diagnosis not present

## 2017-11-08 DIAGNOSIS — H2512 Age-related nuclear cataract, left eye: Secondary | ICD-10-CM | POA: Diagnosis not present

## 2017-11-14 DIAGNOSIS — Z23 Encounter for immunization: Secondary | ICD-10-CM | POA: Diagnosis not present

## 2018-01-12 DIAGNOSIS — R05 Cough: Secondary | ICD-10-CM | POA: Diagnosis not present

## 2018-01-12 DIAGNOSIS — J9610 Chronic respiratory failure, unspecified whether with hypoxia or hypercapnia: Secondary | ICD-10-CM | POA: Diagnosis not present

## 2018-01-12 DIAGNOSIS — I509 Heart failure, unspecified: Secondary | ICD-10-CM | POA: Diagnosis not present

## 2018-01-12 DIAGNOSIS — R54 Age-related physical debility: Secondary | ICD-10-CM | POA: Diagnosis not present

## 2018-01-12 DIAGNOSIS — R0989 Other specified symptoms and signs involving the circulatory and respiratory systems: Secondary | ICD-10-CM | POA: Diagnosis not present

## 2018-01-12 DIAGNOSIS — R062 Wheezing: Secondary | ICD-10-CM | POA: Diagnosis not present

## 2018-01-12 DIAGNOSIS — J449 Chronic obstructive pulmonary disease, unspecified: Secondary | ICD-10-CM | POA: Diagnosis not present

## 2018-01-16 DIAGNOSIS — N3090 Cystitis, unspecified without hematuria: Secondary | ICD-10-CM | POA: Diagnosis not present

## 2018-01-16 DIAGNOSIS — N281 Cyst of kidney, acquired: Secondary | ICD-10-CM | POA: Diagnosis not present

## 2018-01-18 DIAGNOSIS — E785 Hyperlipidemia, unspecified: Secondary | ICD-10-CM | POA: Diagnosis not present

## 2018-01-18 DIAGNOSIS — I482 Chronic atrial fibrillation, unspecified: Secondary | ICD-10-CM | POA: Diagnosis not present

## 2018-01-18 DIAGNOSIS — I509 Heart failure, unspecified: Secondary | ICD-10-CM | POA: Diagnosis not present

## 2018-01-18 DIAGNOSIS — Z87440 Personal history of urinary (tract) infections: Secondary | ICD-10-CM | POA: Diagnosis not present

## 2018-01-18 DIAGNOSIS — M810 Age-related osteoporosis without current pathological fracture: Secondary | ICD-10-CM | POA: Diagnosis not present

## 2018-01-18 DIAGNOSIS — J449 Chronic obstructive pulmonary disease, unspecified: Secondary | ICD-10-CM | POA: Diagnosis not present

## 2018-01-18 DIAGNOSIS — E039 Hypothyroidism, unspecified: Secondary | ICD-10-CM | POA: Diagnosis not present

## 2018-02-15 DIAGNOSIS — I482 Chronic atrial fibrillation, unspecified: Secondary | ICD-10-CM | POA: Diagnosis not present

## 2018-02-15 DIAGNOSIS — R791 Abnormal coagulation profile: Secondary | ICD-10-CM | POA: Diagnosis not present

## 2018-02-15 DIAGNOSIS — Z7901 Long term (current) use of anticoagulants: Secondary | ICD-10-CM | POA: Diagnosis not present

## 2018-02-23 DIAGNOSIS — H6123 Impacted cerumen, bilateral: Secondary | ICD-10-CM | POA: Diagnosis not present

## 2018-02-23 DIAGNOSIS — H9190 Unspecified hearing loss, unspecified ear: Secondary | ICD-10-CM | POA: Diagnosis not present

## 2018-02-23 DIAGNOSIS — R54 Age-related physical debility: Secondary | ICD-10-CM | POA: Diagnosis not present

## 2018-03-30 DIAGNOSIS — Z9181 History of falling: Secondary | ICD-10-CM | POA: Diagnosis not present

## 2018-03-30 DIAGNOSIS — M25571 Pain in right ankle and joints of right foot: Secondary | ICD-10-CM | POA: Diagnosis not present

## 2018-03-30 DIAGNOSIS — J449 Chronic obstructive pulmonary disease, unspecified: Secondary | ICD-10-CM | POA: Diagnosis not present

## 2018-03-30 DIAGNOSIS — R269 Unspecified abnormalities of gait and mobility: Secondary | ICD-10-CM | POA: Diagnosis not present

## 2018-03-30 DIAGNOSIS — M6281 Muscle weakness (generalized): Secondary | ICD-10-CM | POA: Diagnosis not present

## 2018-03-30 DIAGNOSIS — W19XXXA Unspecified fall, initial encounter: Secondary | ICD-10-CM | POA: Diagnosis not present

## 2018-03-30 DIAGNOSIS — R600 Localized edema: Secondary | ICD-10-CM | POA: Diagnosis not present

## 2018-03-30 DIAGNOSIS — R54 Age-related physical debility: Secondary | ICD-10-CM | POA: Diagnosis not present

## 2018-04-21 DIAGNOSIS — I509 Heart failure, unspecified: Secondary | ICD-10-CM | POA: Diagnosis not present

## 2018-04-21 DIAGNOSIS — R05 Cough: Secondary | ICD-10-CM | POA: Diagnosis not present

## 2018-04-21 DIAGNOSIS — J449 Chronic obstructive pulmonary disease, unspecified: Secondary | ICD-10-CM | POA: Diagnosis not present

## 2018-04-21 DIAGNOSIS — I4891 Unspecified atrial fibrillation: Secondary | ICD-10-CM | POA: Diagnosis not present

## 2018-04-21 DIAGNOSIS — R0989 Other specified symptoms and signs involving the circulatory and respiratory systems: Secondary | ICD-10-CM | POA: Diagnosis not present

## 2018-05-01 DIAGNOSIS — J449 Chronic obstructive pulmonary disease, unspecified: Secondary | ICD-10-CM | POA: Diagnosis not present

## 2018-05-01 DIAGNOSIS — J9610 Chronic respiratory failure, unspecified whether with hypoxia or hypercapnia: Secondary | ICD-10-CM | POA: Diagnosis not present

## 2018-05-01 DIAGNOSIS — R54 Age-related physical debility: Secondary | ICD-10-CM | POA: Diagnosis not present

## 2018-05-01 DIAGNOSIS — R062 Wheezing: Secondary | ICD-10-CM | POA: Diagnosis not present

## 2018-05-01 DIAGNOSIS — R05 Cough: Secondary | ICD-10-CM | POA: Diagnosis not present

## 2018-05-05 DIAGNOSIS — J449 Chronic obstructive pulmonary disease, unspecified: Secondary | ICD-10-CM | POA: Diagnosis not present

## 2018-05-05 DIAGNOSIS — R112 Nausea with vomiting, unspecified: Secondary | ICD-10-CM | POA: Diagnosis not present

## 2018-05-05 DIAGNOSIS — R05 Cough: Secondary | ICD-10-CM | POA: Diagnosis not present

## 2018-05-05 DIAGNOSIS — R54 Age-related physical debility: Secondary | ICD-10-CM | POA: Diagnosis not present

## 2018-05-05 DIAGNOSIS — R062 Wheezing: Secondary | ICD-10-CM | POA: Diagnosis not present

## 2018-05-07 DIAGNOSIS — J189 Pneumonia, unspecified organism: Secondary | ICD-10-CM | POA: Diagnosis not present

## 2018-05-09 DIAGNOSIS — H401134 Primary open-angle glaucoma, bilateral, indeterminate stage: Secondary | ICD-10-CM | POA: Diagnosis not present

## 2018-05-18 DIAGNOSIS — R32 Unspecified urinary incontinence: Secondary | ICD-10-CM | POA: Diagnosis not present

## 2018-05-18 DIAGNOSIS — N390 Urinary tract infection, site not specified: Secondary | ICD-10-CM | POA: Diagnosis not present

## 2018-05-24 DIAGNOSIS — J449 Chronic obstructive pulmonary disease, unspecified: Secondary | ICD-10-CM | POA: Diagnosis not present

## 2018-05-24 DIAGNOSIS — I509 Heart failure, unspecified: Secondary | ICD-10-CM | POA: Diagnosis not present

## 2018-05-24 DIAGNOSIS — I10 Essential (primary) hypertension: Secondary | ICD-10-CM | POA: Diagnosis not present

## 2018-05-24 DIAGNOSIS — M910 Juvenile osteochondrosis of pelvis: Secondary | ICD-10-CM | POA: Diagnosis not present

## 2018-05-24 DIAGNOSIS — Z87440 Personal history of urinary (tract) infections: Secondary | ICD-10-CM | POA: Diagnosis not present

## 2018-05-24 DIAGNOSIS — I4891 Unspecified atrial fibrillation: Secondary | ICD-10-CM | POA: Diagnosis not present

## 2018-05-24 DIAGNOSIS — E039 Hypothyroidism, unspecified: Secondary | ICD-10-CM | POA: Diagnosis not present

## 2018-05-26 DIAGNOSIS — E039 Hypothyroidism, unspecified: Secondary | ICD-10-CM | POA: Diagnosis not present

## 2018-05-26 DIAGNOSIS — I10 Essential (primary) hypertension: Secondary | ICD-10-CM | POA: Diagnosis not present

## 2018-05-26 DIAGNOSIS — E559 Vitamin D deficiency, unspecified: Secondary | ICD-10-CM | POA: Diagnosis not present

## 2018-05-26 DIAGNOSIS — D649 Anemia, unspecified: Secondary | ICD-10-CM | POA: Diagnosis not present

## 2018-07-17 ENCOUNTER — Encounter (HOSPITAL_COMMUNITY): Payer: Self-pay | Admitting: Internal Medicine

## 2018-07-17 ENCOUNTER — Inpatient Hospital Stay (HOSPITAL_COMMUNITY)
Admission: AD | Admit: 2018-07-17 | Discharge: 2018-07-19 | DRG: 054 | Disposition: A | Discharge status: Skilled Nursing Facility | Payer: Medicare Other | Source: Other Acute Inpatient Hospital | Attending: Internal Medicine | Admitting: Internal Medicine

## 2018-07-17 ENCOUNTER — Inpatient Hospital Stay (HOSPITAL_COMMUNITY): Payer: Medicare Other

## 2018-07-17 ENCOUNTER — Other Ambulatory Visit: Payer: Self-pay | Admitting: Radiation Therapy

## 2018-07-17 DIAGNOSIS — R4781 Slurred speech: Secondary | ICD-10-CM | POA: Diagnosis not present

## 2018-07-17 DIAGNOSIS — R231 Pallor: Secondary | ICD-10-CM | POA: Diagnosis not present

## 2018-07-17 DIAGNOSIS — Z7401 Bed confinement status: Secondary | ICD-10-CM | POA: Diagnosis not present

## 2018-07-17 DIAGNOSIS — M2550 Pain in unspecified joint: Secondary | ICD-10-CM | POA: Diagnosis not present

## 2018-07-17 DIAGNOSIS — Z209 Contact with and (suspected) exposure to unspecified communicable disease: Secondary | ICD-10-CM | POA: Diagnosis not present

## 2018-07-17 DIAGNOSIS — Z7901 Long term (current) use of anticoagulants: Secondary | ICD-10-CM

## 2018-07-17 DIAGNOSIS — Z880 Allergy status to penicillin: Secondary | ICD-10-CM | POA: Diagnosis not present

## 2018-07-17 DIAGNOSIS — G9389 Other specified disorders of brain: Secondary | ICD-10-CM | POA: Diagnosis not present

## 2018-07-17 DIAGNOSIS — B370 Candidal stomatitis: Secondary | ICD-10-CM | POA: Diagnosis not present

## 2018-07-17 DIAGNOSIS — G9340 Encephalopathy, unspecified: Secondary | ICD-10-CM | POA: Diagnosis present

## 2018-07-17 DIAGNOSIS — Z515 Encounter for palliative care: Secondary | ICD-10-CM | POA: Diagnosis not present

## 2018-07-17 DIAGNOSIS — I509 Heart failure, unspecified: Secondary | ICD-10-CM | POA: Diagnosis not present

## 2018-07-17 DIAGNOSIS — R1310 Dysphagia, unspecified: Secondary | ICD-10-CM | POA: Diagnosis present

## 2018-07-17 DIAGNOSIS — Z66 Do not resuscitate: Secondary | ICD-10-CM | POA: Diagnosis present

## 2018-07-17 DIAGNOSIS — R911 Solitary pulmonary nodule: Secondary | ICD-10-CM | POA: Diagnosis not present

## 2018-07-17 DIAGNOSIS — C7931 Secondary malignant neoplasm of brain: Secondary | ICD-10-CM | POA: Diagnosis not present

## 2018-07-17 DIAGNOSIS — E785 Hyperlipidemia, unspecified: Secondary | ICD-10-CM | POA: Diagnosis not present

## 2018-07-17 DIAGNOSIS — Z87440 Personal history of urinary (tract) infections: Secondary | ICD-10-CM | POA: Diagnosis not present

## 2018-07-17 DIAGNOSIS — R471 Dysarthria and anarthria: Secondary | ICD-10-CM | POA: Diagnosis present

## 2018-07-17 DIAGNOSIS — Z885 Allergy status to narcotic agent status: Secondary | ICD-10-CM | POA: Diagnosis not present

## 2018-07-17 DIAGNOSIS — E875 Hyperkalemia: Secondary | ICD-10-CM | POA: Diagnosis not present

## 2018-07-17 DIAGNOSIS — F17210 Nicotine dependence, cigarettes, uncomplicated: Secondary | ICD-10-CM | POA: Diagnosis present

## 2018-07-17 DIAGNOSIS — R932 Abnormal findings on diagnostic imaging of liver and biliary tract: Secondary | ICD-10-CM | POA: Diagnosis not present

## 2018-07-17 DIAGNOSIS — C3412 Malignant neoplasm of upper lobe, left bronchus or lung: Secondary | ICD-10-CM | POA: Diagnosis not present

## 2018-07-17 DIAGNOSIS — Z888 Allergy status to other drugs, medicaments and biological substances status: Secondary | ICD-10-CM

## 2018-07-17 DIAGNOSIS — R4182 Altered mental status, unspecified: Secondary | ICD-10-CM | POA: Diagnosis not present

## 2018-07-17 DIAGNOSIS — Z87891 Personal history of nicotine dependence: Secondary | ICD-10-CM | POA: Diagnosis not present

## 2018-07-17 DIAGNOSIS — R58 Hemorrhage, not elsewhere classified: Secondary | ICD-10-CM | POA: Diagnosis not present

## 2018-07-17 DIAGNOSIS — J439 Emphysema, unspecified: Secondary | ICD-10-CM | POA: Diagnosis present

## 2018-07-17 DIAGNOSIS — F69 Unspecified disorder of adult personality and behavior: Secondary | ICD-10-CM | POA: Diagnosis not present

## 2018-07-17 DIAGNOSIS — I619 Nontraumatic intracerebral hemorrhage, unspecified: Secondary | ICD-10-CM | POA: Diagnosis not present

## 2018-07-17 DIAGNOSIS — H409 Unspecified glaucoma: Secondary | ICD-10-CM | POA: Diagnosis present

## 2018-07-17 DIAGNOSIS — R4702 Dysphasia: Secondary | ICD-10-CM | POA: Diagnosis present

## 2018-07-17 DIAGNOSIS — Z7189 Other specified counseling: Secondary | ICD-10-CM

## 2018-07-17 DIAGNOSIS — I110 Hypertensive heart disease with heart failure: Secondary | ICD-10-CM | POA: Diagnosis present

## 2018-07-17 DIAGNOSIS — J449 Chronic obstructive pulmonary disease, unspecified: Secondary | ICD-10-CM | POA: Diagnosis not present

## 2018-07-17 DIAGNOSIS — N390 Urinary tract infection, site not specified: Secondary | ICD-10-CM | POA: Diagnosis not present

## 2018-07-17 DIAGNOSIS — I482 Chronic atrial fibrillation, unspecified: Secondary | ICD-10-CM | POA: Diagnosis not present

## 2018-07-17 DIAGNOSIS — I4891 Unspecified atrial fibrillation: Secondary | ICD-10-CM | POA: Diagnosis not present

## 2018-07-17 DIAGNOSIS — B9689 Other specified bacterial agents as the cause of diseases classified elsewhere: Secondary | ICD-10-CM | POA: Diagnosis not present

## 2018-07-17 DIAGNOSIS — R451 Restlessness and agitation: Secondary | ICD-10-CM | POA: Diagnosis not present

## 2018-07-17 DIAGNOSIS — J9600 Acute respiratory failure, unspecified whether with hypoxia or hypercapnia: Secondary | ICD-10-CM | POA: Diagnosis not present

## 2018-07-17 DIAGNOSIS — I629 Nontraumatic intracranial hemorrhage, unspecified: Secondary | ICD-10-CM | POA: Diagnosis not present

## 2018-07-17 DIAGNOSIS — R410 Disorientation, unspecified: Secondary | ICD-10-CM | POA: Diagnosis not present

## 2018-07-17 DIAGNOSIS — R0902 Hypoxemia: Secondary | ICD-10-CM | POA: Diagnosis not present

## 2018-07-17 DIAGNOSIS — R918 Other nonspecific abnormal finding of lung field: Secondary | ICD-10-CM | POA: Diagnosis not present

## 2018-07-17 DIAGNOSIS — I712 Thoracic aortic aneurysm, without rupture: Secondary | ICD-10-CM | POA: Diagnosis not present

## 2018-07-17 HISTORY — DX: Emphysema, unspecified: J43.9

## 2018-07-17 HISTORY — DX: Unspecified glaucoma: H40.9

## 2018-07-17 HISTORY — DX: Unspecified atrial fibrillation: I48.91

## 2018-07-17 HISTORY — DX: Heart failure, unspecified: I50.9

## 2018-07-17 LAB — PROTIME-INR
INR: 1.4 — ABNORMAL HIGH (ref 0.8–1.2)
Prothrombin Time: 16.7 seconds — ABNORMAL HIGH (ref 11.4–15.2)

## 2018-07-17 MED ORDER — NYSTATIN NICU ORAL SYRINGE 100,000 UNITS/ML
2.0000 mL | Freq: Four times a day (QID) | OROMUCOSAL | Status: DC
  Administered 2018-07-17 (×2): 2 mL via ORAL
  Filled 2018-07-17 (×4): qty 2

## 2018-07-17 MED ORDER — LORATADINE 10 MG PO TABS
10.0000 mg | ORAL_TABLET | Freq: Every day | ORAL | Status: DC
  Filled 2018-07-17: qty 1

## 2018-07-17 MED ORDER — IOHEXOL 300 MG/ML  SOLN
100.0000 mL | Freq: Once | INTRAMUSCULAR | Status: AC | PRN
  Administered 2018-07-17: 100 mL via INTRAVENOUS

## 2018-07-17 MED ORDER — FLUCONAZOLE IN SODIUM CHLORIDE 200-0.9 MG/100ML-% IV SOLN
200.0000 mg | Freq: Once | INTRAVENOUS | Status: AC
  Administered 2018-07-17: 200 mg via INTRAVENOUS
  Filled 2018-07-17: qty 100

## 2018-07-17 MED ORDER — LOPERAMIDE HCL 2 MG PO CAPS: 2.0000 mg | ORAL_CAPSULE | ORAL | Status: DC | PRN

## 2018-07-17 MED ORDER — MONTELUKAST SODIUM 10 MG PO TABS
10.0000 mg | ORAL_TABLET | Freq: Every day | ORAL | Status: DC
  Administered 2018-07-17: 10 mg via ORAL
  Filled 2018-07-17: qty 1

## 2018-07-17 MED ORDER — GADOBUTROL 1 MMOL/ML IV SOLN
7.0000 mL | Freq: Once | INTRAVENOUS | Status: AC | PRN
  Administered 2018-07-17: 7 mL via INTRAVENOUS

## 2018-07-17 MED ORDER — ALBUTEROL SULFATE (2.5 MG/3ML) 0.083% IN NEBU: 2.5000 mg | INHALATION_SOLUTION | Freq: Four times a day (QID) | RESPIRATORY_TRACT | Status: DC | PRN

## 2018-07-17 MED ORDER — UMECLIDINIUM BROMIDE 62.5 MCG/INH IN AEPB: 1.0000 | INHALATION_SPRAY | Freq: Every day | RESPIRATORY_TRACT | Status: DC

## 2018-07-17 MED ORDER — ACETAMINOPHEN 650 MG RE SUPP: 650.0000 mg | Freq: Four times a day (QID) | RECTAL | Status: DC | PRN

## 2018-07-17 MED ORDER — NITROFURANTOIN MONOHYD MACRO 100 MG PO CAPS
100.0000 mg | ORAL_CAPSULE | Freq: Every day | ORAL | Status: DC
  Administered 2018-07-17: 100 mg via ORAL
  Filled 2018-07-17 (×2): qty 1

## 2018-07-17 MED ORDER — SODIUM CHLORIDE 0.9 % IV SOLN
INTRAVENOUS | Status: DC
  Administered 2018-07-17: 16:00:00 via INTRAVENOUS

## 2018-07-17 MED ORDER — STROKE: EARLY STAGES OF RECOVERY BOOK
Freq: Once | Status: AC
  Administered 2018-07-17: 23:00:00

## 2018-07-17 MED ORDER — MOMETASONE FURO-FORMOTEROL FUM 200-5 MCG/ACT IN AERO
2.0000 | INHALATION_SPRAY | Freq: Two times a day (BID) | RESPIRATORY_TRACT | Status: DC
  Administered 2018-07-17 – 2018-07-18 (×2): 2 via RESPIRATORY_TRACT
  Filled 2018-07-17: qty 8.8

## 2018-07-17 MED ORDER — FLUTICASONE PROPIONATE 50 MCG/ACT NA SUSP
2.0000 | Freq: Every day | NASAL | Status: DC
  Administered 2018-07-18: 2 via NASAL
  Filled 2018-07-17: qty 16

## 2018-07-17 MED ORDER — BRIMONIDINE TARTRATE 0.2 % OP SOLN
1.0000 [drp] | Freq: Two times a day (BID) | OPHTHALMIC | Status: DC
  Administered 2018-07-17 – 2018-07-18 (×2): 1 [drp] via OPHTHALMIC
  Filled 2018-07-17: qty 5

## 2018-07-17 MED ORDER — ONDANSETRON HCL 4 MG/2ML IJ SOLN: 4.0000 mg | Freq: Four times a day (QID) | INTRAMUSCULAR | Status: DC | PRN

## 2018-07-17 MED ORDER — ONDANSETRON HCL 4 MG PO TABS: 4.0000 mg | ORAL_TABLET | Freq: Four times a day (QID) | ORAL | Status: DC | PRN

## 2018-07-17 MED ORDER — ACETAMINOPHEN 325 MG PO TABS: 650.0000 mg | ORAL_TABLET | Freq: Four times a day (QID) | ORAL | Status: DC | PRN

## 2018-07-17 MED ORDER — LATANOPROST 0.005 % OP SOLN
1.0000 [drp] | Freq: Every day | OPHTHALMIC | Status: DC
  Administered 2018-07-17: 1 [drp] via OPHTHALMIC
  Filled 2018-07-17: qty 2.5

## 2018-07-17 MED ORDER — UMECLIDINIUM BROMIDE 62.5 MCG/INH IN AEPB
1.0000 | INHALATION_SPRAY | Freq: Every day | RESPIRATORY_TRACT | Status: DC
  Administered 2018-07-18: 1 via RESPIRATORY_TRACT
  Filled 2018-07-17: qty 7

## 2018-07-17 MED ORDER — METOPROLOL SUCCINATE ER 25 MG PO TB24
75.0000 mg | ORAL_TABLET | Freq: Two times a day (BID) | ORAL | Status: DC
  Administered 2018-07-17: 23:00:00 75 mg via ORAL
  Filled 2018-07-17 (×2): qty 3

## 2018-07-17 MED ORDER — EMETROL 1.87-1.87-21.5 PO SOLN: 10.0000 mL | ORAL | Status: DC | PRN

## 2018-07-17 MED ORDER — HYDRALAZINE HCL 20 MG/ML IJ SOLN: 10.0000 mg | INTRAMUSCULAR | Status: DC | PRN

## 2018-07-17 MED ORDER — POLYETHYL GLYCOL-PROPYL GLYCOL 0.4-0.3 % OP GEL: OPHTHALMIC | Status: DC | PRN

## 2018-07-17 MED ORDER — NUTRITIONAL DRINK PO LIQD: 1.0000 | Freq: Three times a day (TID) | ORAL | Status: DC

## 2018-07-17 NOTE — H&P (Signed)
History and Physical    Amy Wilkerson QIO:[id number] DOB: December 19, 1930 DOA: 07/17/2018  Referring MD/NP/PA: Gean Birchwood, MD PCP: Amy Brothers, MD  Patient coming from: Transfer from [facility]  Chief Complaint: Altered mental status  I have personally briefly reviewed patient's old medical records in [place]   HPI: Amy Wilkerson is a 85 y.o. female with medical history significant of COPD/emphysema, CHF, atrial fibrillation on anticoagulation, hypertension, and glaucoma; who after being found to be acutely altered.  Patient was sent from her assisted living facility to [facility].  History is limited due to patient being currently altered.  From review of records patient was last noted to be normal at approximately 2300 last night.  EMS was called as the patient had woke up screaming and was combative.  Patient noted to have slurred speech.  CT angiogram of the head neck revealed 2 hemorrhagic masses with one in the right cerebellum 18 mm, and the other in the left occipital lobe 13 mm without any significant mass-effect.  Imaging also revealed signs of a 2.7 cm spiculated mass in the left upper lobe concerning for bronchogenic carcinoma.  Labs revealed relatively normal CBC, BUN 22, creatinine 0.7, and INR 2.4.  Urinalysis was positive for many yeast.  Chest x-ray did not show any acute abnormalities.  Patient was given 1 g of Rocephin, total of 10 mg of hydralazine, 1 L of normal saline IV fluids, phytonadione, and anti-inhibitor coagulant complex.  Due to need of consultative services patient was transferred to [facility] as an inpatient.  Patient son Amy Wilkerson is her healthcare power of attorney and reports that he does not feel that the patient could tolerate any kind of surgical procedure.  She is to remain a DNR.  ED Course: As seen above  Review of Systems  Unable to perform ROS: Mental status change  Neurological: Positive for speech change.    Past  Medical History:  Diagnosis Date   Atrial fibrillation ([place])    CHF (congestive heart failure) ([place])    Emphysema/COPD ([place])    Glaucoma     Past Surgical History:  Procedure Laterality Date   KNEE ARTHROSCOPY       reports that she quit smoking about 11 years ago. Her smoking use included cigarettes. She has a 50.00 pack-year smoking history. She does not have any smokeless tobacco history on file. She reports previous alcohol use. She reports previous drug use.  Allergies  Allergen Reactions   Codeine Other (See Comments)    Felt like she was going to die.   Moxifloxacin Hcl Diarrhea   Penicillins Rash    No family history on file.  Prior to Admission medications   Not on File    Physical Exam:  Constitutional: Elderly female who appears to be in no acute distress at this time Vitals:   07/17/18 1130  BP: 113/67  Pulse: 80  Resp: 17  Temp: 97.7 F (36.5 C)  TempSrc: Oral  SpO2: 100%   Eyes: PERRL, lids and conjunctivae normal ENMT: Mucous membranes are dry with what appears to be thrush present. Neck: normal, supple, no masses, no thyromegaly Respiratory: clear to auscultation bilaterally, no wheezing, no crackles. Normal respiratory effort. No accessory muscle use.  Cardiovascular: Regular rate and rhythm, no murmurs / rubs / gallops. No extremity edema. 2+ pedal pulses. No carotid bruits.  Abdomen: no tenderness, no masses palpated. No hepatosplenomegaly. Bowel sounds positive.  Musculoskeletal: no clubbing / cyanosis. No joint deformity upper  and lower extremities. Good ROM, no contractures. Normal muscle tone.  Skin: no rashes, lesions, ulcers. No induration Neurologic: CN 2-12 grossly intact. Sensation intact, DTR normal. Strength 5/5 in all 4.  Psychiatric: Alert and oriented x 1.  Confused.    Labs on Admission: I have personally reviewed following labs and imaging studies  CBC: No results for input(s): WBC, NEUTROABS, HGB, HCT, MCV, PLT in  the last 168 hours. Basic Metabolic Panel: No results for input(s): NA, K, CL, CO2, GLUCOSE, BUN, CREATININE, CALCIUM, MG, PHOS in the last 168 hours. GFR: CrCl cannot be calculated (No successful lab value found.). Liver Function Tests: No results for input(s): AST, ALT, ALKPHOS, BILITOT, PROT, ALBUMIN in the last 168 hours. No results for input(s): LIPASE, AMYLASE in the last 168 hours. No results for input(s): AMMONIA in the last 168 hours. Coagulation Profile: No results for input(s): INR, PROTIME in the last 168 hours. Cardiac Enzymes: No results for input(s): CKTOTAL, CKMB, CKMBINDEX, TROPONINI in the last 168 hours. BNP (last 3 results) No results for input(s): PROBNP in the last 8760 hours. HbA1C: No results for input(s): HGBA1C in the last 72 hours. CBG: No results for input(s): GLUCAP in the last 168 hours. Lipid Profile: No results for input(s): CHOL, HDL, LDLCALC, TRIG, CHOLHDL, LDLDIRECT in the last 72 hours. Thyroid Function Tests: No results for input(s): TSH, T4TOTAL, FREET4, T3FREE, THYROIDAB in the last 72 hours. Anemia Panel: No results for input(s): VITAMINB12, FOLATE, FERRITIN, TIBC, IRON, RETICCTPCT in the last 72 hours. Urine analysis: No results found for: COLORURINE, APPEARANCEUR, LABSPEC, PHURINE, GLUCOSEU, HGBUR, BILIRUBINUR, KETONESUR, PROTEINUR, UROBILINOGEN, NITRITE, LEUKOCYTESUR Sepsis Labs: No results found for this or any previous visit (from the past 240 hour(s)).   Radiological Exams on Admission: No results found.  EKG: Independently reviewed from outside facility.  Atrial fibrillation at 76 bpm  Assessment/Plan Hemorrhagic brain mass: Acute.  Presenting as a transfer after being found to have 2 hemorrhagic brain lesions without mass-effect and 2.7 cm spiculated mass of the left upper lobe.  Suspect underlying malignancy not known.  Patient son noted that he did not feel like his mother could tolerate any operative procedure. -Admit to a  progressive bed -Neurochecks  -Goal systolic blood pressure less than 140 -Hold anticoagulants -Check MRI of the brain -Consulted Dr. Mickeal Skinner of neurology oncology, will follow for further recommendations   -May warrant a palliative care consultation  Dysarthria and acute encephalopathy: Patient noted to have slurred speech and currently altered.  Suspect likely secondary to above. -Speech therapy to eval and treat -Dysphasia 1 diet per recommendations crushed meds -Aspiration precaution  Lung mass: 2.7 cm left upper lobe lung mass on CT angiogram of the neck thought to likely be the primary lesion -Check CT chest/abd/plv -Follow-up imaging and determined this possible area of biopsy  Atrial fibrillation(unspecified) on chronic anticoagulation: Chronic.  Patient appears rate controlled and initial INR therapeutic at 2.4.  Patient had been given reversal agents due to acute intracranial bleeding found. -Hold Coumadin  -Continue metoprolol   Oral candidiasis and Yeast infection: Acute.  Urinalysis obtained at outside facility was significant for yeast.  Patient has been given 1 g of Rocephin at the outside facility. -May consider following up with [facility] health regarding any cultures obtained -Give Diflucan 200 mg IV x1 dose -Nystatin swish and swallow every 6 hours  COPD/emphysema: Patient without signs of acute exacerbation at this time.   -Continue Incruse and substitution of Dulera for Advair -Albuterol nebs as needed  DVT prophylaxis: Scds Code Status: DNR  Family Communication: Discussed with the son plan of care with the patient's  Disposition Plan:  TBD Consults called: Neuro oncology Admission status: Inpatient  Norval Morton MD [facility] Pager [phone]   If 7PM-7AM, please contact night-coverage [email or web address] Password [email or web address]  07/17/2018, 1:09 PM

## 2018-07-17 NOTE — Progress Notes (Signed)
Called to get an update on pt for MRI, but can't get patient for exam because pt needs a recent Creatinine before giving patient MRI contrast.  RN was made aware who said CT also needed lab values. Will try to get patient once updated lab values have been posted to chart.

## 2018-07-17 NOTE — Progress Notes (Signed)
Foley d/c by previous RN and patient is due to void.

## 2018-07-17 NOTE — Progress Notes (Signed)
Spoke with Dr. Jeralyn Ruths who is alright with MRI to use the Creatinine listed in the H&P from 5/11.  Creatinine was calculated at [facility] prior to the pt being transferred to [facility]. Creatinine was .7 according to Dr. Tamala Julian  H&P Note.

## 2018-07-17 NOTE — Evaluation (Addendum)
Clinical/Bedside Swallow Evaluation Patient Details  Name: Amy Wilkerson MRN: [id number] Date of Birth: [birth year]  Today's Date: 07/17/2018 Time: SLP Start Time (ACUTE ONLY): 1425 SLP Stop Time (ACUTE ONLY): 1500 SLP Time Calculation (min) (ACUTE ONLY): 35 min  Past Medical History:  Past Medical History:  Diagnosis Date  . Atrial fibrillation ([place])   . CHF (congestive heart failure) ([place])   . Emphysema/COPD ([place])   . Glaucoma    Past Surgical History:  Past Surgical History:  Procedure Laterality Date  . KNEE ARTHROSCOPY     HPI:  85 year old female admitted 07/17/2018 with AMS. PMH: COPD, emphysema, CHF, AFib, HTN, glaucoma. ALF resident. CT angiogram = 2 hemorrhagic masses, 91mm in the right cerebellum, and 72mm in the left occipital lobe without significant mass effect. A 2.7cm spiculated mass in left upper lobe concerning for bronchogenic carcinoma was also found. CXR negative.   Assessment / Plan / Recommendation Clinical Impression  Pt presents with poor oral health. Thick secretions were noted on palate and tongue. Sores also noted on tongue and inside cheeks, and pt had what appeared to be thrush throughout the oral cavity. Her mouth was dark pink, and poor oral care recently was evident. Pt's dentures were removed and cleaned, then replaced in pt mouth. Pt accepted trials of ice chips, thin liquid, and puree. She declined solid trials. Pt exhibited no anterior leakage or obvious oral residue or deficit. No overt s/s aspiration observed on any consistency, however, pt is at increased risk for aspiration given advanced age, cognitive impairment, and dx COPD.  Recommend puree (dys 1) diet and thin liquids, crushed meds, 1:1 assistance with po intake, and aggressive oral care. Safe swallow precautions posted at [facility] and results/recommendations relayed to RN and MD. SLP will follow for diet tolerance assessment and education.     SLP Visit Diagnosis: Dysphagia, unspecified (R13.10)     Aspiration Risk  Moderate aspiration risk    Diet Recommendation Dysphagia 1 (Puree);Thin liquid   Liquid Administration via: Cup;Straw Medication Administration: Crushed with puree Supervision: Staff to assist with self feeding;Full supervision/cueing for compensatory strategies Compensations: Minimize environmental distractions;Slow rate;Small sips/bites Postural Changes: Seated upright at 90 degrees;Remain upright for at least 30 minutes after po intake    Other  Recommendations Oral Care Recommendations: Oral care before and after PO;Staff/trained caregiver to provide oral care;Other (Comment)(suction set up in pt room)   Follow up Recommendations 24 hour supervision/assistance;Skilled Nursing facility      Frequency and Duration min 2x/week  2 weeks       Prognosis Prognosis for Safe Diet Advancement: Fair Barriers to Reach Goals: Cognitive deficits      Swallow Study   General Date of Onset: 07/17/18 HPI: 85 year old female admitted 07/17/2018 with AMS. PMH: COPD, emphysema, CHF, AFib, HTN, glaucoma. ALF resident. CT angiogram = 2 hemorrhagic masses, 36mm in the right cerebellum, and 58mm in the left occipital lobe without significant mass effect. A 2.7cm spiculated mass in left upper lobe concerning for bronchogenic carcinoma was also found. CXR negative. Type of Study: Bedside Swallow Evaluation Previous Swallow Assessment: none found Diet Prior to this Study: NPO Temperature Spikes Noted: No Respiratory Status: Room air History of Recent Intubation: No Behavior/Cognition: Alert;Confused;Requires cueing Oral Cavity Assessment: Lesions(thick secretions in oral cavity) Oral Care Completed by SLP: Yes Oral Cavity - Dentition: Dentures, top;Dentures, bottom Vision: Functional for self-feeding Self-Feeding Abilities: Total assist Patient Positioning: Postural control interferes with function(pt leaning to the right, curled up) Baseline Vocal  Quality: Normal Volitional  Cough: Cognitively unable to elicit Volitional Swallow: Unable to elicit    Oral/Motor/Sensory Function Overall Oral Motor/Sensory Function: Within functional limits   Ice Chips Ice chips: Within functional limits Presentation: Spoon   Thin Liquid Thin Liquid: Within functional limits Presentation: Straw    Nectar Thick Nectar Thick Liquid: Not tested   Honey Thick Honey Thick Liquid: Not tested   Puree Puree: Within functional limits Presentation: Spoon   Solid     Solid: Not tested(pt declined)     Hubert Raatz B. Quentin Ore [facility] [place] Speech Language Pathologist [phone]  Shonna Chock 07/17/2018,3:09 PM

## 2018-07-17 NOTE — Progress Notes (Signed)
Reviewed patient imaging at the request of admitting hospitalist.  Lesions c/w likely hemorrhagic metastases with suspected bronchogenic primary.  Both lesions would theoretically be suitable for radiosurgery pending more detailed imaging.    Recommend obtaining MRI brain w and w/o contrast and completing workup/staging for presumed biopsy/bronch.  Patient will be referred to brain/spine tumor board for further discussion and care implementation once diagnosis is obtained.   Ventura Sellers, MD

## 2018-07-18 DIAGNOSIS — R471 Dysarthria and anarthria: Secondary | ICD-10-CM

## 2018-07-18 DIAGNOSIS — R451 Restlessness and agitation: Secondary | ICD-10-CM

## 2018-07-18 DIAGNOSIS — Z515 Encounter for palliative care: Secondary | ICD-10-CM

## 2018-07-18 DIAGNOSIS — Z7189 Other specified counseling: Secondary | ICD-10-CM

## 2018-07-18 MED ORDER — MORPHINE SULFATE (PF) 2 MG/ML IV SOLN: 1.0000 mg | INTRAVENOUS | Status: DC | PRN

## 2018-07-18 MED ORDER — NYSTATIN 100000 UNIT/ML MT SUSP
5.0000 mL | Freq: Three times a day (TID) | OROMUCOSAL | Status: DC
  Administered 2018-07-18 (×2): 500000 [IU] via OROMUCOSAL
  Filled 2018-07-18 (×2): qty 5

## 2018-07-18 MED ORDER — LORAZEPAM 1 MG PO TABS: 1.0000 mg | ORAL_TABLET | ORAL | Status: DC | PRN

## 2018-07-18 MED ORDER — LORAZEPAM 2 MG/ML PO CONC: 1.0000 mg | ORAL | Status: DC | PRN

## 2018-07-18 MED ORDER — LORAZEPAM 2 MG/ML IJ SOLN
1.0000 mg | INTRAMUSCULAR | Status: DC | PRN
  Administered 2018-07-18: 1 mg via INTRAVENOUS
  Filled 2018-07-18: qty 1

## 2018-07-18 NOTE — Progress Notes (Signed)
Patient has not voided yet since foley was d/c at 1904. Bladder scan 289 mls. On call NP notified.

## 2018-07-18 NOTE — TOC Initial Note (Signed)
Transition of Care [facility]) - Initial/Assessment Note    Patient Details  Name: Amy Wilkerson MRN: [id number] Date of Birth: 1930-06-15  Transition of Care [facility] CM/SW Contact:    Geralynn Ochs, LCSW Phone Number: 07/18/2018, 5:05 PM  Clinical Narrative:   CSW received call from palliative medicine NP about patient's son's preference to have patient return to ALF. CSW contacted RN at [facility] to discuss patient's care needs, and they are able to provide hospice care in the ALF. [employer] prefers to utilize either [facility] or [facility]. CSW contacted patient's son, Amy Wilkerson, to discuss return to ALF with hospice. Amy Wilkerson appreciative of information, very happy to hear that ALF can take the patient back. Amy Wilkerson indicated no real preference for hospice service, said that [facility] would be good. CSW gave referral to [facility]. CSW to follow.                Expected Discharge Plan: Assisted Living     Patient Goals and CMS Choice Patient states their goals for this hospitalization and ongoing recovery are:: patient's son would like the patient to be comfortable and back at her ALF   Choice offered to / list presented to : Adult Children  Expected Discharge Plan and Services Expected Discharge Plan: Assisted Living     Post Acute Care Choice: Hospice Living arrangements for the past 2 months: [place]                                      Prior Living Arrangements/Services Living arrangements for the past 2 months: [place] Lives with:: Facility Resident          Need for Family Participation in Patient Care: Yes (Comment) Care giver support system in place?: Yes (comment) Current home services: Hospice    Activities of Daily Living      Permission Sought/Granted                  Emotional Assessment   Attitude/Demeanor/Rapport: Unable to Assess Affect (typically  observed): Unable to Assess Orientation: : Oriented to Self Alcohol / Substance Use: Not Applicable Psych Involvement: No (comment)  Admission diagnosis:  Hemorrhagic Brain Mets  Patient Active Problem List   Diagnosis Date Noted  . Goals of care, counseling/discussion   . Palliative care by specialist   . End of life care   . Agitation   . Brain metastases ([place]) 07/17/2018  . Oral candidiasis 07/17/2018  . Unspecified atrial fibrillation ([place]) 07/17/2018  . ICH (intracerebral hemorrhage) ([place]) 07/17/2018  . Dysarthria 07/17/2018  . Emphysema/COPD ([place])   . CHF (congestive heart failure) ([place])    PCP:  Garwin Brothers, MD Pharmacy:  No Pharmacies Listed    Social Determinants of Health (SDOH) Interventions    Readmission Risk Interventions No flowsheet data found.

## 2018-07-18 NOTE — Consult Note (Signed)
Consultation Note Date: 07/18/2018   Patient Name: Amy Wilkerson  DOB: 17-Jan-1931  MRN: [id number]  Age / Sex: 85 y.o., female  PCP: Garwin Brothers, MD Referring Physician: Hosie Poisson, MD  Reason for Consultation: Establishing goals of care, Hospice Evaluation, Non pain symptom management, Pain control, Psychosocial/spiritual support and Terminal Care  HPI/Patient Profile: 85 y.o. female  with past medical history of COPD, CHF, a fib on anticoagulation, and HTN admitted on 07/17/2018 with AMS. Patient was living at [place] - transferred to [facility] d/t AMS and then transferred to [facility].  CT angiogram revealed 2 hemorrhagic masses in her brain. Also found to have mass in left upper lobe of lung concerning for bronchogenic carcinoma. PMT consulted for [place].  Clinical Assessment and Goals of Care: I have reviewed medical records including EPIC notes, labs and imaging, received report from RN and Dr. Karleen Hampshire, and then spoke with patient's son, Amy Wilkerson,  to discuss diagnosis prognosis, [place], EOL wishes, disposition and options.  I introduced Palliative Medicine as specialized medical care for people living with serious illness. It focuses on providing relief from the symptoms and stress of a serious illness. The goal is to improve quality of life for both the patient and the family.  We discussed a brief life review of the patient. He tells me what a good mother the patient has been to him. He tells me she has lived at her ALF for ten years and it is her home. Tells me they speak on the phone daily and prior to pandemic he took her out to eat every Wednesday night and saw her multiple times over the weekend.   As far as functional and nutritional status, he tells me of her frailty and speaks of a decline.    We discussed her current illness and what it means in the larger context of her on-going co-morbidities.  Natural disease trajectory and expectations at  EOL were discussed. Amy Wilkerson understands that picture is concerning for metastatic cancer. He understands his mother appears to be in the final stage of life as she is minimally responsive and refusing food and drink.  I attempted to elicit values and goals of care important to the patient.  He tells me quality of life is much more important to his mother than quantity and he hopes to honor her wish.   The difference between aggressive medical intervention and comfort care was considered in light of the patient's goals of care. He confirms desire to focus only on her comfort and avoid all aggressive medical interventions including lab work and IV fluids.   Advance directives, concepts specific to code status, artifical feeding and hydration, and rehospitalization were considered and discussed. Patient is DNR and would not want life artificially prolonged, per her son Amy Wilkerson.  Hospice and Palliative Care services outpatient were explained and offered. Amy Wilkerson is interested in hospice care - he requests that she be returned back to her ALF since she has lived there for ten years and that is her home - he hopes she can receive hospice care there. We discuss that Ms. Shallenberger is dependent in most of her care needs and may require more care than the ALF can provide. He expresses understanding but shares his ultimate goal is she return there. If this is not feasible, he is agreeable to hospice facility in [facility] - I discussed this with social worker Kathlee Nations.   Questions and concerns were addressed. The family was encouraged to call with questions or concerns.  Primary Decision Maker NEXT OF KIN - son Amy Wilkerson    SUMMARY OF RECOMMENDATIONS   - full comfort care - no labs, IV fluids, no cardiac monitor - orders have been adjusted - per RN, retaining urine - ordered indwelling catheter - continue DNR status - added meds for comfort including ativan and morphine - Amy Wilkerson is hopeful patient can  return so ALF with hospice, if not, he agrees to hospice facility in [facility] - discussed with social work  Code Status/Advance Care Planning:  DNR  Palliative Prophylaxis:   Aspiration, Delirium Protocol, Frequent Pain Assessment, Oral Care and Turn Reposition  Additional Recommendations (Limitations, Scope, Preferences):  Full Comfort Care  Psycho-social/Spiritual:   Desire for further Chaplaincy support:no  Additional Recommendations: Education on Hospice  Prognosis:   < 2 weeks  Discharge Planning: To Be Determined      Primary Diagnoses: Present on Admission: . Brain metastases ([place]) . Emphysema/COPD ([place]) . Oral candidiasis . Unspecified atrial fibrillation ([place]) . ICH (intracerebral hemorrhage) ([place]) . Dysarthria   I have reviewed the medical record, interviewed the patient and family, and examined the patient. The following aspects are pertinent.  Past Medical History:  Diagnosis Date  . Atrial fibrillation ([place])   . CHF (congestive heart failure) ([place])   . Emphysema/COPD ([place])   . Glaucoma    Social History   Socioeconomic History  . Marital status: Widowed    Spouse name: Not on file  . Number of children: Not on file  . Years of education: Not on file  . Highest education level: Not on file  Occupational History  . Not on file  Social Needs  . Financial resource strain: Not on file  . Food insecurity:    Worry: Not on file    Inability: Not on file  . Transportation needs:    Medical: Not on file    Non-medical: Not on file  Tobacco Use  . Smoking status: Former Smoker    Packs/day: 1.00    Years: 50.00    Pack years: 50.00    Types: Cigarettes    Last attempt to quit: 03/19/2007    Years since quitting: 11.3  Substance and Sexual Activity  . Alcohol use: Not Currently  . Drug use: Not Currently  . Sexual activity: Not on file  Lifestyle  . Physical activity:    Days per week: Not on file    Minutes per session: Not on  file  . Stress: Not on file  Relationships  . Social connections:    Talks on phone: Not on file    Gets together: Not on file    Attends religious service: Not on file    Active member of club or organization: Not on file    Attends meetings of clubs or organizations: Not on file    Relationship status: Not on file  Other Topics Concern  . Not on file  Social History Narrative  . Not on file   Family History  Problem Relation Age of Onset  . Lung cancer Brother    Scheduled Meds: Continuous Infusions: PRN Meds:.acetaminophen **OR** acetaminophen, albuterol, loperamide, LORazepam **OR** LORazepam **OR** LORazepam, morphine injection, ondansetron **OR** ondansetron (ZOFRAN) IV Allergies  Allergen Reactions  . Codeine Other (See Comments)    Felt like she was going to die.  . Moxifloxacin Hcl Diarrhea  . Penicillins Rash    Vital Signs: BP (!) 131/55 (BP Location: Left Arm)   Pulse 65   Temp 98.4  F (36.9 C) (Axillary)   Resp 17   Ht 5\' 4"  (1.626 m)   Wt 75 kg   SpO2 96%   BMI 28.38 kg/m  Pain Scale: 0-10   Pain Score: 0-No pain   SpO2: SpO2: 96 % O2 Device:SpO2: 96 % O2 Flow Rate: .   IO: Intake/output summary:   Intake/Output Summary (Last 24 hours) at 07/18/2018 1402 Last data filed at 07/18/2018 0400 Gross per 24 hour  Intake 498.05 ml  Output 750 ml  Net -251.95 ml    LBM: Last BM Date: 07/18/18 Baseline Weight: Weight: 75 kg Most recent weight: Weight: 75 kg     Palliative Assessment/Data: PPS 10%    The above conversation was completed via telephone due to the visitor restrictions during the COVID-19 pandemic. Thorough chart review and discussion with necessary members of the care team was completed as part of assessment. All issues were discussed and addressed but no physical exam was performed.   Time Total: 70 minutes Greater than 50%  of this time was spent counseling and coordinating care related to the above assessment and plan.  Juel Burrow, DNP, AGNP-C Palliative Medicine Team [phone] Pager: [phone]

## 2018-07-18 NOTE — Progress Notes (Signed)
Bladder scan patient at the said time. 325mls retained. Straight cath performed per order and 350cc obtained. Will continue to monitor.

## 2018-07-18 NOTE — Progress Notes (Signed)
SLP Cancellation Note  Patient Details Name: Amy Wilkerson MRN: [id number] DOB: 1930-08-19   Cancelled treatment:    Pt refusing oral care, POs of any kind.  Combative and yelling. Will continue efforts next day.  Amy Wilkerson, Amy Wilkerson CCC/SLP Acute Rehabilitation Services Office number [phone] Pager [phone]        Amy Wilkerson 07/18/2018, 12:08 PM

## 2018-07-18 NOTE — Progress Notes (Signed)
Pt is refusing almost all meds and care including cleaning residual food in her mouth.

## 2018-07-18 NOTE — Progress Notes (Signed)
PROGRESS NOTE    Amy Wilkerson  WYO:[id number] DOB: 1930/03/18 DOA: 07/17/2018 PCP: Garwin Brothers, MD   Brief Narrative: Amy Wilkerson is a 85 y.o. female with medical history significant of COPD/emphysema, CHF, atrial fibrillation on anticoagulation, hypertension, and glaucoma  Admitted for altered mental status.  Patient was sent from her assisted living facility to [facility].  CT angiogram of the head neck revealed 2 hemorrhagic masses with one in the right cerebellum 18 mm, and the other in the left occipital lobe 13 mm without any significant mass-effect.  Imaging also revealed signs of a 2.7 cm spiculated mass in the left upper lobe concerning for bronchogenic carcinoma.  She was transferred to [facility] for further evaluation.  On discussing with her son over the phone, he did not want any biopsies or chemo therapy or radiation treatment, and would like to transition her to hospice at [place] if possible.   Meanwhile palliative care consulted for transition to hospice? Comfort care and management of symptoms.   Currently she is refusing any care , oral meds.   Assessment & Plan:   Principal Problem:   ICH (intracerebral hemorrhage) (HCC) Active Problems:   Brain metastases (HCC)   Emphysema/COPD (HCC)   CHF (congestive heart failure) (HCC)   Oral candidiasis   Unspecified atrial fibrillation (HCC)   Dysarthria   Acute hemorrhagic brain metastasis with a 2.7 spiculated left upper lobe nodule in the lung Oncology consulted and recommendations given. Discussed with the son over the phone who wants to transfer her to hospice at her assisted living facility if possible versus residential hospice. Pain control and symptomatic management. Palliative care consulted to assist with transition to hospice/comfort care and symptomatic management.    Atrial fibrillation on chronic anticoagulation Which is being held for new hemorrhagic brain mets Currently rate  control with metoprolol but unfortunately patient's been refusing all oral medications.    COPD/emphysema Inhalers as needed.   Dysarthria /dysphagia SLP evaluation recommending dysphagia 1 diet.   DVT prophylaxis: SCDs  code Status: DNR Family Communication: Discussed with son over the phone Disposition Plan: Possible transition to hospice in the next 24 to 48 hours   Consultants:   Oncology  Palliative care  Procedures: MRI of the brain Antimicrobials: None  Subjective: Patient appears comfortable not in any kind of distress  Objective: Vitals:   07/18/18 0817 07/18/18 0838 07/18/18 1000 07/18/18 1133  BP: 137/66   (!) 131/55  Pulse: 68   65  Resp: 15   17  Temp: 98.1 F (36.7 C)   98.4 F (36.9 C)  TempSrc: Axillary   Axillary  SpO2: 95% 96%  96%  Weight:   75 kg   Height:   5\' 4"  (1.626 m)     Intake/Output Summary (Last 24 hours) at 07/18/2018 1245 Last data filed at 07/18/2018 0400 Gross per 24 hour  Intake 498.05 ml  Output 750 ml  Net -251.95 ml   Filed Weights   07/18/18 1000  Weight: 75 kg    Examination:  General exam: Appears calm and comfortable  Respiratory system: Clear to auscultation. Respiratory effort normal. Cardiovascular system: S1 & S2 heard, irregular Gastrointestinal system: Abdomen is nondistended, soft and nontender.Normal bowel sounds heard. Central nervous system: Alert but confused Extremities: No pedal edema Skin: No rashes, lesions or ulcers Psychiatry: Cannot be assessed    Data Reviewed: I have personally reviewed following labs and imaging studies  CBC: No results for input(s): WBC, NEUTROABS, HGB, HCT, MCV,  PLT in the last 168 hours. Basic Metabolic Panel: No results for input(s): NA, K, CL, CO2, GLUCOSE, BUN, CREATININE, CALCIUM, MG, PHOS in the last 168 hours. GFR: CrCl cannot be calculated (No successful lab value found.). Liver Function Tests: No results for input(s): AST, ALT, ALKPHOS, BILITOT, PROT,  ALBUMIN in the last 168 hours. No results for input(s): LIPASE, AMYLASE in the last 168 hours. No results for input(s): AMMONIA in the last 168 hours. Coagulation Profile: Recent Labs  Lab 07/17/18 1409  INR 1.4*   Cardiac Enzymes: No results for input(s): CKTOTAL, CKMB, CKMBINDEX, TROPONINI in the last 168 hours. BNP (last 3 results) No results for input(s): PROBNP in the last 8760 hours. HbA1C: No results for input(s): HGBA1C in the last 72 hours. CBG: No results for input(s): GLUCAP in the last 168 hours. Lipid Profile: No results for input(s): CHOL, HDL, LDLCALC, TRIG, CHOLHDL, LDLDIRECT in the last 72 hours. Thyroid Function Tests: No results for input(s): TSH, T4TOTAL, FREET4, T3FREE, THYROIDAB in the last 72 hours. Anemia Panel: No results for input(s): VITAMINB12, FOLATE, FERRITIN, TIBC, IRON, RETICCTPCT in the last 72 hours. Sepsis Labs: No results for input(s): PROCALCITON, LATICACIDVEN in the last 168 hours.  No results found for this or any previous visit (from the past 240 hour(s)).       Radiology Studies: Ct Chest W Contrast  Result Date: 07/18/2018 CLINICAL DATA:  Left upper lobe mass EXAM: CT CHEST, ABDOMEN, AND PELVIS WITH CONTRAST TECHNIQUE: Multidetector CT imaging of the chest, abdomen and pelvis was performed following the standard protocol during bolus administration of intravenous contrast. CONTRAST:  135mL OMNIPAQUE IOHEXOL 300 MG/ML  SOLN COMPARISON:  None. FINDINGS: CT CHEST FINDINGS Cardiovascular: Calcific atherosclerosis of the aorta. The ascending aorta is dilated, measuring 4.5 cm. There is coronary artery atherosclerosis. No pericardial effusion. Heart size is normal. Mediastinum/Nodes: No enlarged mediastinal, hilar, or axillary lymph nodes. Thyroid gland, trachea, and esophagus demonstrate no significant findings. Lungs/Pleura: Medial left upper lobe pulmonary nodule measures 2.2 x 1.4 cm. Dependent subsegmental atelectasis. No pleural effusion or  pneumothorax. Musculoskeletal: Bilateral glenohumeral osteoarthrosis. No lytic or sclerotic lesions. CT ABDOMEN PELVIS FINDINGS HEPATOBILIARY: Subcentimeter hypodensity in the right hepatic lobe, too small characterize accurately. The gallbladder is normal. There is vicarious excretion of contrast. PANCREAS: Fatty atrophy of the pancreas. SPLEEN: Normal. ADRENALS/URINARY TRACT: --Adrenal glands: Normal. --Right kidney/ureter: No hydronephrosis, nephroureterolithiasis, perinephric stranding or solid renal mass. --Left kidney/ureter: No hydronephrosis, nephroureterolithiasis, perinephric stranding or solid renal mass. --Urinary bladder: Normal for degree of distention STOMACH/BOWEL: --Stomach/Duodenum: There is no hiatal hernia or other gastric abnormality. The duodenal course and caliber are normal. --Small bowel: No dilatation or inflammation. --Colon: No focal abnormality. --Appendix: Normal. VASCULAR/LYMPHATIC: There is aortic atherosclerosis without hemodynamically significant stenosis. The portal vein, splenic vein, superior mesenteric vein and IVC are patent. No abdominal or pelvic lymphadenopathy. REPRODUCTIVE: Normal uterus and ovaries. MUSCULOSKELETAL. Grade 1 L4-5 anterolisthesis secondary to facet hypertrophy. Bilateral hip osteoarthrosis. OTHER: None. IMPRESSION: 1. 2.2 cm left upper lobe pulmonary nodule, concerning for bronchogenic carcinoma. 2. No other pulmonary nodules or evidence of metastatic disease in the chest. 3. Subcentimeter hypodensity within the right hepatic lobe, too small to characterize accurately. 4. 4.4 cm ascending thoracic aorta. Recommend annual imaging followup by CTA or MRA. This recommendation follows 2010 ACCF/AHA/AATS/ACR/ASA/SCA/SCAI/SIR/STS/SVM Guidelines for the Diagnosis and Management of Patients with Thoracic Aortic Disease. Circulation. 2010; 121: Y865-H846. Aortic aneurysm NOS (ICD10-I71.9) Aortic atherosclerosis (ICD10-I70.0). Electronically Signed   By: Cletus Gash.D.  On: 07/18/2018 00:17   Mr Jeri Cos LZ Contrast  Result Date: 07/17/2018 CLINICAL DATA:  Altered mental status EXAM: MRI HEAD WITHOUT AND WITH CONTRAST TECHNIQUE: Multiplanar, multiecho pulse sequences of the brain and surrounding structures were obtained without and with intravenous contrast. CONTRAST:  7 mL Gadavist COMPARISON:  CTA head neck 07/17/2018 FINDINGS: BRAIN: There hemorrhagic masses again demonstrated in the right cerebellum and left occipital lobe. There is little to no associated contrast enhancement however. There is mild surrounding edema at both sites without midline shift or other mass effect. Throughout the supratentorial white matter, there is confluent hyperintense T2-weighted signal most suggestive of chronic ischemic microangiopathy. Susceptibility weighted imaging shows multifocal chronic microhemorrhage in addition to the above described hemorrhagic lesions. No acute ischemia. VASCULAR: The major intracranial arterial and venous sinus flow voids are normal. SKULL AND UPPER CERVICAL SPINE: There is a large lipoma of the right posterior neck soft tissues that measures at least 9.0 x 4 7 cm. SINUSES/ORBITS: No fluid levels or advanced mucosal thickening. No mastoid or middle ear effusion. The orbits are normal. IMPRESSION: 1. Nonenhancing hemorrhagic lesions of the right cerebellum and left occipital lobe, as demonstrated on earlier head CT, most consistent with hemorrhagic metastases. However, the lack contrast enhancement is atypical. 2. Advanced white matter changes of chronic ischemic microangiopathy. 3. Multifocal chronic microhemorrhage in a pattern suggesting hypertensive angiopathy. Electronically Signed   By: Ulyses Jarred M.D.   On: 07/17/2018 22:27   Ct Abdomen Pelvis W Contrast  Result Date: 07/18/2018 CLINICAL DATA:  Left upper lobe mass EXAM: CT CHEST, ABDOMEN, AND PELVIS WITH CONTRAST TECHNIQUE: Multidetector CT imaging of the chest, abdomen and pelvis was  performed following the standard protocol during bolus administration of intravenous contrast. CONTRAST:  162mL OMNIPAQUE IOHEXOL 300 MG/ML  SOLN COMPARISON:  None. FINDINGS: CT CHEST FINDINGS Cardiovascular: Calcific atherosclerosis of the aorta. The ascending aorta is dilated, measuring 4.5 cm. There is coronary artery atherosclerosis. No pericardial effusion. Heart size is normal. Mediastinum/Nodes: No enlarged mediastinal, hilar, or axillary lymph nodes. Thyroid gland, trachea, and esophagus demonstrate no significant findings. Lungs/Pleura: Medial left upper lobe pulmonary nodule measures 2.2 x 1.4 cm. Dependent subsegmental atelectasis. No pleural effusion or pneumothorax. Musculoskeletal: Bilateral glenohumeral osteoarthrosis. No lytic or sclerotic lesions. CT ABDOMEN PELVIS FINDINGS HEPATOBILIARY: Subcentimeter hypodensity in the right hepatic lobe, too small characterize accurately. The gallbladder is normal. There is vicarious excretion of contrast. PANCREAS: Fatty atrophy of the pancreas. SPLEEN: Normal. ADRENALS/URINARY TRACT: --Adrenal glands: Normal. --Right kidney/ureter: No hydronephrosis, nephroureterolithiasis, perinephric stranding or solid renal mass. --Left kidney/ureter: No hydronephrosis, nephroureterolithiasis, perinephric stranding or solid renal mass. --Urinary bladder: Normal for degree of distention STOMACH/BOWEL: --Stomach/Duodenum: There is no hiatal hernia or other gastric abnormality. The duodenal course and caliber are normal. --Small bowel: No dilatation or inflammation. --Colon: No focal abnormality. --Appendix: Normal. VASCULAR/LYMPHATIC: There is aortic atherosclerosis without hemodynamically significant stenosis. The portal vein, splenic vein, superior mesenteric vein and IVC are patent. No abdominal or pelvic lymphadenopathy. REPRODUCTIVE: Normal uterus and ovaries. MUSCULOSKELETAL. Grade 1 L4-5 anterolisthesis secondary to facet hypertrophy. Bilateral hip osteoarthrosis.  OTHER: None. IMPRESSION: 1. 2.2 cm left upper lobe pulmonary nodule, concerning for bronchogenic carcinoma. 2. No other pulmonary nodules or evidence of metastatic disease in the chest. 3. Subcentimeter hypodensity within the right hepatic lobe, too small to characterize accurately. 4. 4.4 cm ascending thoracic aorta. Recommend annual imaging followup by CTA or MRA. This recommendation follows 2010 ACCF/AHA/AATS/ACR/ASA/SCA/SCAI/SIR/STS/SVM Guidelines for the Diagnosis and Management of Patients with Thoracic  Aortic Disease. Circulation. 2010; 121: V564-P329. Aortic aneurysm NOS (ICD10-I71.9) Aortic atherosclerosis (ICD10-I70.0). Electronically Signed   By: Ulyses Jarred M.D.   On: 07/18/2018 00:17        Scheduled Meds:  brimonidine  1 drop Both Eyes BID   fluticasone  2 spray Each Nare Daily   latanoprost  1 drop Both Eyes QHS   loratadine  10 mg Oral Daily   metoprolol succinate  75 mg Oral BID   mometasone-formoterol  2 puff Inhalation BID   montelukast  10 mg Oral QHS   nitrofurantoin (macrocrystal-monohydrate)  100 mg Oral Daily   nystatin  5 mL Mouth/Throat TID AC & HS   umeclidinium bromide  1 puff Inhalation Daily   Continuous Infusions:  sodium chloride 50 mL/hr at 07/17/18 1617     LOS: 1 day    Time spent: [place], MD [facility] Pager [phone]  If 7PM-7AM, please contact night-coverage [email or web address] Password [facility] 07/18/2018, 12:45 PM

## 2018-07-19 DIAGNOSIS — Z7189 Other specified counseling: Secondary | ICD-10-CM

## 2018-07-19 NOTE — [place] FL2 (Signed)
[place] MEDICAID FL2 LEVEL OF CARE SCREENING TOOL     IDENTIFICATION  Patient Name: Amy Wilkerson Birthdate: January 21, 1931 Sex: female Admission Date (Current Location): 07/17/2018  [facility] and [place] Number:  [employer] and Address:  [facility], [place] [address], [place], [place] [address]      Provider Number: [id number]  Attending Physician Name and Address:  Damita Lack, MD  Relative Name and Phone Number:       Current Level of Care: Hospital Recommended Level of Care: [place] Prior Approval Number:    Date Approved/Denied:   PASRR Number:    Discharge Plan: Other (Comment)(ALF)    Current Diagnoses: Patient Active Problem List   Diagnosis Date Noted  . Goals of care, counseling/discussion   . Palliative care by specialist   . End of life care   . Agitation   . Brain metastases ([place]) 07/17/2018  . Oral candidiasis 07/17/2018  . Unspecified atrial fibrillation ([place]) 07/17/2018  . ICH (intracerebral hemorrhage) ([place]) 07/17/2018  . Dysarthria 07/17/2018  . Emphysema/COPD ([place])   . CHF (congestive heart failure) (HCC)     Orientation RESPIRATION BLADDER Height & Weight     Self  Normal Incontinent Weight: 165 lb 5.5 oz (75 kg) Height:  5\' 4"  (162.6 cm)  BEHAVIORAL SYMPTOMS/MOOD NEUROLOGICAL BOWEL NUTRITION STATUS      Incontinent Diet(regular (comfort feeds))  AMBULATORY STATUS COMMUNICATION OF NEEDS Skin   Extensive Assist Verbally Normal                       Personal Care Assistance Level of Assistance  Bathing, Feeding, Dressing Bathing Assistance: Maximum assistance Feeding assistance: Maximum assistance Dressing Assistance: Maximum assistance     Functional Limitations Info  Sight, Hearing, Speech Sight Info: Adequate Hearing Info: Adequate Speech Info: Impaired    SPECIAL CARE FACTORS FREQUENCY                       Contractures Contractures Info: Not present     Additional Factors Info  Code Status, Allergies Code Status Info: DNR Allergies Info: Codeine, Moxifloxacin Hcl, Penicillins           Current Medications (07/19/2018):  This is the current hospital active medication list Current Facility-Administered Medications  Medication Dose Route Frequency Provider Last Rate Last Dose  . acetaminophen (TYLENOL) tablet 650 mg  650 mg Oral Q6H PRN Norval Morton, MD       Or  . acetaminophen (TYLENOL) suppository 650 mg  650 mg Rectal Q6H PRN Smith, Rondell A, MD      . albuterol (PROVENTIL) (2.5 MG/3ML) 0.083% nebulizer solution 2.5 mg  2.5 mg Nebulization Q6H PRN Smith, Rondell A, MD      . loperamide (IMODIUM) capsule 2-4 mg  2-4 mg Oral PRN Smith, Rondell A, MD      . LORazepam (ATIVAN) tablet 1 mg  1 mg Oral Q4H PRN Philis Pique, NP       Or  . LORazepam (ATIVAN) 2 MG/ML concentrated solution 1 mg  1 mg Sublingual Q4H PRN Philis Pique, NP       Or  . LORazepam (ATIVAN) injection 1 mg  1 mg Intravenous Q4H PRN Philis Pique, NP   1 mg at 07/18/18 1538  . morphine 2 MG/ML injection 1 mg  1 mg Intravenous Q2H PRN Philis Pique, NP      .  ondansetron (ZOFRAN) tablet 4 mg  4 mg Oral Q6H PRN Norval Morton, MD       Or  . ondansetron (ZOFRAN) injection 4 mg  4 mg Intravenous Q6H PRN Norval Morton, MD         Discharge Medications: STOP taking these medications   acetaminophen 325 MG tablet Commonly known as:  TYLENOL   acetaminophen 500 MG tablet Commonly known as:  TYLENOL   albuterol (2.5 MG/3ML) 0.083% nebulizer solution Commonly known as:  PROVENTIL   alendronate 70 MG tablet Commonly known as:  FOSAMAX   anti-nausea solution   benzonatate 200 MG capsule Commonly known as:  TESSALON   BLUE-EMU MAXIMUM STRENGTH EX   brimonidine 0.2 % ophthalmic solution Commonly known as:  ALPHAGAN   calcium-vitamin D 500-200 MG-UNIT tablet Commonly known as:  OSCAL WITH D   cetirizine 5 MG  tablet Commonly known as:  ZYRTEC   fluticasone 50 MCG/ACT nasal spray Commonly known as:  FLONASE   Fluticasone-Salmeterol 250-50 MCG/DOSE Aepb Commonly known as:  ADVAIR   glucosamine-chondroitin 500-400 MG tablet   Incruse Ellipta 62.5 MCG/INH Aepb Generic drug:  umeclidinium bromide   ipratropium-albuterol 0.5-2.5 (3) MG/3ML Soln Commonly known as:  DUONEB   latanoprost 0.005 % ophthalmic solution Commonly known as:  XALATAN   loperamide 2 MG capsule Commonly known as:  IMODIUM   magnesium hydroxide 400 MG/5ML suspension Commonly known as:  MILK OF MAGNESIA   metoprolol succinate 25 MG 24 hr tablet Commonly known as:  TOPROL-XL   montelukast 10 MG tablet Commonly known as:  SINGULAIR   multivitamin with minerals Tabs tablet   nitrofurantoin (macrocrystal-monohydrate) 100 MG capsule Commonly known as:  MACROBID   NUTRITIONAL DRINK PO   SYSTANE OP   warfarin 1 MG tablet Commonly known as:  COUMADIN   warfarin 2.5 MG tablet Commonly known as:  COUMADIN   warfarin 3 MG tablet Commonly known as:  COUMADIN     TAKE these medications   ipratropium-albuterol 0.5-2.5 (3) MG/3ML Soln Commonly known as:  DUONEB Take 3 mLs by nebulization every 6 (six) hours.      Relevant Imaging Results:  Relevant Lab Results:   Additional Information SS#: [id number]  Geralynn Ochs, LCSW

## 2018-07-19 NOTE — Progress Notes (Signed)
Palliative Medicine RN Note: Symptom check. Patient refused breakfast from NT (I witnessed this) then immediately said we aren't feeding her. I attempted to help with meal; she refused all food/drink. However, her meal was pureed; spoke with PMT NP who will change diet order, as she is comfort care.   Patient denied pain. No s/s dyspnea; only complaint is she wants "to get out of here." SW has been facilitating d/c with hospice.  Marjie Skiff Shameeka Silliman, RN, BSN, [facility] Palliative Medicine Team 07/19/2018 10:04 AM Office [phone]

## 2018-07-19 NOTE — Discharge Summary (Addendum)
Physician Discharge Summary  Amy Wilkerson EQA:[id number] DOB: 08-25-1930 DOA: 07/17/2018  PCP: Amy Brothers, MD  Admit date: 07/17/2018 Discharge date: 07/19/2018  Admitted From:  ALF Disposition:  Hospice  Recommendations for Outpatient Follow-up:  1. Follow up with PCP in 1-2 weeks 2. Please obtain BMP/CBC in one week your next doctors visit.  3. Hospice care  DNR/DNI Hospice/Comfort Care  Brief/Interim Summary: 85 year old with history of COPD, CHF, chronic atrial fibrillation on anticoagulation, essential hypertension and glaucoma came from assisted living facility for change in mental status.  In the ER CT a angiogram of head and neck showed 2 hemorrhagic masses 1 in right cerebellum and left occipital without masslike effect.  Also had 2.7 cm spiculated mass in the left upper lobe concerning for bronchogenic carcinoma.  INR at 2.4.  UA suggested yeast therefore given Diflucan.  After extensive conversation by the previous provider with the family was determined to transition patient to comfort care and discontinuing all the anticoagulation.  Speech and swallow recommended dysphagia 1 diet therefore comfort feeding were started. Transition patient to hospice care today.  Discharge Diagnoses:  Principal Problem:   ICH (intracerebral hemorrhage) (HCC) Active Problems:   Brain metastases (HCC)   Emphysema/COPD (HCC)   CHF (congestive heart failure) (HCC)   Oral candidiasis   Unspecified atrial fibrillation (HCC)   Dysarthria   Goals of care, counseling/discussion   Palliative care by specialist   End of life care   Agitation  Acute hemorrhagic brain metastases- left occipital and right cerebral 2.7 cm left upper lobe spiculated mass concerning for bronchogenic carcinoma - After extensive discussion with the patient's son by the previous provider it was determined to transition patient to comfort care and hospice.Family does not want any aggressive measures at this time.  Atrial  fibrillation on chronic anticoagulation - Discontinue anticoagulation due to new hemorrhagic brain lesions.  Continue metoprolol.  COPD/emphysema -Continue inhalers as needed  Dysphagia/this for 3-year -Dysphagia 1 diet per speech and swallow.  Would encourage comfort feeding  Patient on SCDs during the hospitalization She is DNR/DNI Spoke with the patient's son Amy Wilkerson over the phone and answered all the questions today. Transition patient to hospice  Consultations:  Oncology  Palliative care  Subjective: Patient only alert to her name but otherwise does not appear to be in any acute distress.  Denies any complaints.  No other acute events overnight.  Discharge Exam: Vitals:   07/18/18 2231 07/19/18 0937  BP: 123/85 129/77  Pulse: 94 96  Resp: 18 18  Temp: (!) 97.5 F (36.4 C) 97.8 F (36.6 C)  SpO2: 96% 97%   Vitals:   07/18/18 1000 07/18/18 1133 07/18/18 2231 07/19/18 0937  BP:  (!) 131/55 123/85 129/77  Pulse:  65 94 96  Resp:  17 18 18   Temp:  98.4 F (36.9 C) (!) 97.5 F (36.4 C) 97.8 F (36.6 C)  TempSrc:  Axillary Oral Oral  SpO2:  96% 96% 97%  Weight: 75 kg     Height: 5\' 4"  (1.626 m)       General: Awake and only alert to her name. Cardiovascular: RRR, S1/S2 +, no rubs, no gallops Respiratory: CTA bilaterally, no wheezing, no rhonchi Abdominal: Soft, NT, ND, bowel sounds + Extremities: no edema, no cyanosis  Discharge Instructions  Discharge Instructions    Diet - low sodium heart healthy   Complete by:  As directed    Increase activity slowly   Complete by:  As directed  Allergies as of 07/19/2018      Reactions   Codeine Other (See Comments)   Felt like she was going to die.   Moxifloxacin Hcl Diarrhea   Penicillins Rash      Medication List    STOP taking these medications   acetaminophen 325 MG tablet Commonly known as:  TYLENOL   acetaminophen 500 MG tablet Commonly known as:  TYLENOL   albuterol (2.5 MG/3ML) 0.083%  nebulizer solution Commonly known as:  PROVENTIL   alendronate 70 MG tablet Commonly known as:  FOSAMAX   anti-nausea solution   benzonatate 200 MG capsule Commonly known as:  TESSALON   BLUE-EMU MAXIMUM STRENGTH EX   brimonidine 0.2 % ophthalmic solution Commonly known as:  ALPHAGAN   calcium-vitamin D 500-200 MG-UNIT tablet Commonly known as:  OSCAL WITH D   cetirizine 5 MG tablet Commonly known as:  ZYRTEC   fluticasone 50 MCG/ACT nasal spray Commonly known as:  FLONASE   Fluticasone-Salmeterol 250-50 MCG/DOSE Aepb Commonly known as:  ADVAIR   glucosamine-chondroitin 500-400 MG tablet   Incruse Ellipta 62.5 MCG/INH Aepb Generic drug:  umeclidinium bromide   latanoprost 0.005 % ophthalmic solution Commonly known as:  XALATAN   loperamide 2 MG capsule Commonly known as:  IMODIUM   magnesium hydroxide 400 MG/5ML suspension Commonly known as:  MILK OF MAGNESIA   metoprolol succinate 25 MG 24 hr tablet Commonly known as:  TOPROL-XL   montelukast 10 MG tablet Commonly known as:  SINGULAIR   multivitamin with minerals Tabs tablet   nitrofurantoin (macrocrystal-monohydrate) 100 MG capsule Commonly known as:  MACROBID   NUTRITIONAL DRINK PO   SYSTANE OP   warfarin 1 MG tablet Commonly known as:  COUMADIN   warfarin 2.5 MG tablet Commonly known as:  COUMADIN   warfarin 3 MG tablet Commonly known as:  COUMADIN     TAKE these medications   ipratropium-albuterol 0.5-2.5 (3) MG/3ML Soln Commonly known as:  DUONEB Take 3 mLs by nebulization every 6 (six) hours.      Follow-up Information    Amy Brothers, MD. Schedule an appointment as soon as possible for a visit in 1 week(s).   Specialty:  Internal Medicine Contact information: [address] [place] [address] [phone]          Allergies  Allergen Reactions  . Codeine Other (See Comments)    Felt like she was going to die.  . Moxifloxacin Hcl Diarrhea  . Penicillins Rash     You were cared for by a hospitalist during your hospital stay. If you have any questions about your discharge medications or the care you received while you were in the hospital after you are discharged, you can call the unit and asked to speak with the hospitalist on call if the hospitalist that took care of you is not available. Once you are discharged, your primary care physician will handle any further medical issues. Please note that no refills for any discharge medications will be authorized once you are discharged, as it is imperative that you return to your primary care physician (or establish a relationship with a primary care physician if you do not have one) for your aftercare needs so that they can reassess your need for medications and monitor your lab values.   Procedures/Studies: Ct Chest W Contrast  Result Date: 07/18/2018 CLINICAL DATA:  Left upper lobe mass EXAM: CT CHEST, ABDOMEN, AND PELVIS WITH CONTRAST TECHNIQUE: Multidetector CT imaging of the chest, abdomen  and pelvis was performed following the standard protocol during bolus administration of intravenous contrast. CONTRAST:  155mL OMNIPAQUE IOHEXOL 300 MG/ML  SOLN COMPARISON:  None. FINDINGS: CT CHEST FINDINGS Cardiovascular: Calcific atherosclerosis of the aorta. The ascending aorta is dilated, measuring 4.5 cm. There is coronary artery atherosclerosis. No pericardial effusion. Heart size is normal. Mediastinum/Nodes: No enlarged mediastinal, hilar, or axillary lymph nodes. Thyroid gland, trachea, and esophagus demonstrate no significant findings. Lungs/Pleura: Medial left upper lobe pulmonary nodule measures 2.2 x 1.4 cm. Dependent subsegmental atelectasis. No pleural effusion or pneumothorax. Musculoskeletal: Bilateral glenohumeral osteoarthrosis. No lytic or sclerotic lesions. CT ABDOMEN PELVIS FINDINGS HEPATOBILIARY: Subcentimeter hypodensity in the right hepatic lobe, too small characterize accurately. The gallbladder is  normal. There is vicarious excretion of contrast. PANCREAS: Fatty atrophy of the pancreas. SPLEEN: Normal. ADRENALS/URINARY TRACT: --Adrenal glands: Normal. --Right kidney/ureter: No hydronephrosis, nephroureterolithiasis, perinephric stranding or solid renal mass. --Left kidney/ureter: No hydronephrosis, nephroureterolithiasis, perinephric stranding or solid renal mass. --Urinary bladder: Normal for degree of distention STOMACH/BOWEL: --Stomach/Duodenum: There is no hiatal hernia or other gastric abnormality. The duodenal course and caliber are normal. --Small bowel: No dilatation or inflammation. --Colon: No focal abnormality. --Appendix: Normal. VASCULAR/LYMPHATIC: There is aortic atherosclerosis without hemodynamically significant stenosis. The portal vein, splenic vein, superior mesenteric vein and IVC are patent. No abdominal or pelvic lymphadenopathy. REPRODUCTIVE: Normal uterus and ovaries. MUSCULOSKELETAL. Grade 1 L4-5 anterolisthesis secondary to facet hypertrophy. Bilateral hip osteoarthrosis. OTHER: None. IMPRESSION: 1. 2.2 cm left upper lobe pulmonary nodule, concerning for bronchogenic carcinoma. 2. No other pulmonary nodules or evidence of metastatic disease in the chest. 3. Subcentimeter hypodensity within the right hepatic lobe, too small to characterize accurately. 4. 4.4 cm ascending thoracic aorta. Recommend annual imaging followup by CTA or MRA. This recommendation follows 2010 ACCF/AHA/AATS/ACR/ASA/SCA/SCAI/SIR/STS/SVM Guidelines for the Diagnosis and Management of Patients with Thoracic Aortic Disease. Circulation. 2010; 121: T517-O160. Aortic aneurysm NOS (ICD10-I71.9) Aortic atherosclerosis (ICD10-I70.0). Electronically Signed   By: Ulyses Jarred M.D.   On: 07/18/2018 00:17   Mr Jeri Cos VP Contrast  Result Date: 07/17/2018 CLINICAL DATA:  Altered mental status EXAM: MRI HEAD WITHOUT AND WITH CONTRAST TECHNIQUE: Multiplanar, multiecho pulse sequences of the brain and surrounding  structures were obtained without and with intravenous contrast. CONTRAST:  7 mL Gadavist COMPARISON:  CTA head neck 07/17/2018 FINDINGS: BRAIN: There hemorrhagic masses again demonstrated in the right cerebellum and left occipital lobe. There is little to no associated contrast enhancement however. There is mild surrounding edema at both sites without midline shift or other mass effect. Throughout the supratentorial white matter, there is confluent hyperintense T2-weighted signal most suggestive of chronic ischemic microangiopathy. Susceptibility weighted imaging shows multifocal chronic microhemorrhage in addition to the above described hemorrhagic lesions. No acute ischemia. VASCULAR: The major intracranial arterial and venous sinus flow voids are normal. SKULL AND UPPER CERVICAL SPINE: There is a large lipoma of the right posterior neck soft tissues that measures at least 9.0 x 4 7 cm. SINUSES/ORBITS: No fluid levels or advanced mucosal thickening. No mastoid or middle ear effusion. The orbits are normal. IMPRESSION: 1. Nonenhancing hemorrhagic lesions of the right cerebellum and left occipital lobe, as demonstrated on earlier head CT, most consistent with hemorrhagic metastases. However, the lack contrast enhancement is atypical. 2. Advanced white matter changes of chronic ischemic microangiopathy. 3. Multifocal chronic microhemorrhage in a pattern suggesting hypertensive angiopathy. Electronically Signed   By: Ulyses Jarred M.D.   On: 07/17/2018 22:27   Ct Abdomen Pelvis W Contrast  Result Date:  07/18/2018 CLINICAL DATA:  Left upper lobe mass EXAM: CT CHEST, ABDOMEN, AND PELVIS WITH CONTRAST TECHNIQUE: Multidetector CT imaging of the chest, abdomen and pelvis was performed following the standard protocol during bolus administration of intravenous contrast. CONTRAST:  142mL OMNIPAQUE IOHEXOL 300 MG/ML  SOLN COMPARISON:  None. FINDINGS: CT CHEST FINDINGS Cardiovascular: Calcific atherosclerosis of the aorta.  The ascending aorta is dilated, measuring 4.5 cm. There is coronary artery atherosclerosis. No pericardial effusion. Heart size is normal. Mediastinum/Nodes: No enlarged mediastinal, hilar, or axillary lymph nodes. Thyroid gland, trachea, and esophagus demonstrate no significant findings. Lungs/Pleura: Medial left upper lobe pulmonary nodule measures 2.2 x 1.4 cm. Dependent subsegmental atelectasis. No pleural effusion or pneumothorax. Musculoskeletal: Bilateral glenohumeral osteoarthrosis. No lytic or sclerotic lesions. CT ABDOMEN PELVIS FINDINGS HEPATOBILIARY: Subcentimeter hypodensity in the right hepatic lobe, too small characterize accurately. The gallbladder is normal. There is vicarious excretion of contrast. PANCREAS: Fatty atrophy of the pancreas. SPLEEN: Normal. ADRENALS/URINARY TRACT: --Adrenal glands: Normal. --Right kidney/ureter: No hydronephrosis, nephroureterolithiasis, perinephric stranding or solid renal mass. --Left kidney/ureter: No hydronephrosis, nephroureterolithiasis, perinephric stranding or solid renal mass. --Urinary bladder: Normal for degree of distention STOMACH/BOWEL: --Stomach/Duodenum: There is no hiatal hernia or other gastric abnormality. The duodenal course and caliber are normal. --Small bowel: No dilatation or inflammation. --Colon: No focal abnormality. --Appendix: Normal. VASCULAR/LYMPHATIC: There is aortic atherosclerosis without hemodynamically significant stenosis. The portal vein, splenic vein, superior mesenteric vein and IVC are patent. No abdominal or pelvic lymphadenopathy. REPRODUCTIVE: Normal uterus and ovaries. MUSCULOSKELETAL. Grade 1 L4-5 anterolisthesis secondary to facet hypertrophy. Bilateral hip osteoarthrosis. OTHER: None. IMPRESSION: 1. 2.2 cm left upper lobe pulmonary nodule, concerning for bronchogenic carcinoma. 2. No other pulmonary nodules or evidence of metastatic disease in the chest. 3. Subcentimeter hypodensity within the right hepatic lobe, too  small to characterize accurately. 4. 4.4 cm ascending thoracic aorta. Recommend annual imaging followup by CTA or MRA. This recommendation follows 2010 ACCF/AHA/AATS/ACR/ASA/SCA/SCAI/SIR/STS/SVM Guidelines for the Diagnosis and Management of Patients with Thoracic Aortic Disease. Circulation. 2010; 121: Y606-T016. Aortic aneurysm NOS (ICD10-I71.9) Aortic atherosclerosis (ICD10-I70.0). Electronically Signed   By: Ulyses Jarred M.D.   On: 07/18/2018 00:17     The results of significant diagnostics from this hospitalization (including imaging, microbiology, ancillary and laboratory) are listed below for reference.     Microbiology: No results found for this or any previous visit (from the past 240 hour(s)).   Labs: BNP (last 3 results) No results for input(s): BNP in the last 8760 hours. Basic Metabolic Panel: No results for input(s): NA, K, CL, CO2, GLUCOSE, BUN, CREATININE, CALCIUM, MG, PHOS in the last 168 hours. Liver Function Tests: No results for input(s): AST, ALT, ALKPHOS, BILITOT, PROT, ALBUMIN in the last 168 hours. No results for input(s): LIPASE, AMYLASE in the last 168 hours. No results for input(s): AMMONIA in the last 168 hours. CBC: No results for input(s): WBC, NEUTROABS, HGB, HCT, MCV, PLT in the last 168 hours. Cardiac Enzymes: No results for input(s): CKTOTAL, CKMB, CKMBINDEX, TROPONINI in the last 168 hours. BNP: Invalid input(s): POCBNP CBG: No results for input(s): GLUCAP in the last 168 hours. D-Dimer No results for input(s): DDIMER in the last 72 hours. Hgb A1c No results for input(s): HGBA1C in the last 72 hours. Lipid Profile No results for input(s): CHOL, HDL, LDLCALC, TRIG, CHOLHDL, LDLDIRECT in the last 72 hours. Thyroid function studies No results for input(s): TSH, T4TOTAL, T3FREE, THYROIDAB in the last 72 hours.  Invalid input(s): FREET3 Anemia work up No results for input(s):  VITAMINB12, FOLATE, FERRITIN, TIBC, IRON, RETICCTPCT in the last 72  hours. Urinalysis No results found for: COLORURINE, APPEARANCEUR, LABSPEC, [place], GLUCOSEU, HGBUR, BILIRUBINUR, KETONESUR, PROTEINUR, UROBILINOGEN, NITRITE, LEUKOCYTESUR Sepsis Labs Invalid input(s): PROCALCITONIN,  WBC,  LACTICIDVEN Microbiology No results found for this or any previous visit (from the past 240 hour(s)).   Time coordinating discharge:  I have spent 35 minutes face to face with the patient and on the ward discussing the patients care, assessment, plan and disposition with other care givers. >50% of the time was devoted counseling the patient about the risks and benefits of treatment/Discharge disposition and coordinating care.   SIGNED:   Damita Lack, MD  [facility] 07/19/2018, 11:26 AM   If 7PM-7AM, please contact night-coverage [email or web address]

## 2018-07-19 NOTE — TOC Transition Note (Signed)
Transition of Care [facility]) - CM/SW Discharge Note   Patient Details  Name: Amy Wilkerson MRN: [id number] Date of Birth: 12-15-1930  [facility] CM/SW Contact:  Geralynn Ochs, LCSW Phone Number: 07/19/2018, 1:38 PM   Clinical Narrative:   Nurse to call report to [phone].    Final next level of care: Assisted Living Barriers to Discharge: No Barriers Identified   Patient Goals and CMS Choice Patient states their goals for this hospitalization and ongoing recovery are:: patient's son would like the patient to be comfortable and back at her ALF   Choice offered to / list presented to : Adult Children  Discharge Placement                Patient to be transferred to facility by: [place] Name of family member notified: [name] Patient and family notified of of transfer: 07/19/18  Discharge Plan and Services     Post Acute Care Choice: Hospice                               Social Determinants of Health (SDOH) Interventions     Readmission Risk Interventions No flowsheet data found.

## 2018-07-19 NOTE — Progress Notes (Signed)
Daily Progress Note   Patient Name: Amy Wilkerson       Date: 07/19/2018 DOB: [birth year]  Age: 85 y.o. MRN#: [id number] Attending Physician: Damita Lack, MD Primary Care Physician: Garwin Brothers, MD Admit Date: 07/17/2018  Reason for Consultation/Follow-up: Establishing goals of care, Hospice Evaluation, Non pain symptom management, Pain control, Psychosocial/spiritual support and Terminal Care  Subjective: Palliative RN checked on patient this am - comfortable no distress other than requesting to leave the hospital and have food that was not pureed  Length of Stay: 2  Current Medications: Scheduled Meds:    Continuous Infusions:   PRN Meds: acetaminophen **OR** acetaminophen, albuterol, loperamide, LORazepam **OR** LORazepam **OR** LORazepam, morphine injection, ondansetron **OR** ondansetron (ZOFRAN) IV     Vital Signs: BP 129/77 (BP Location: Left Arm)   Pulse 96   Temp 97.8 F (36.6 C) (Oral)   Resp 18   Ht 5\' 4"  (1.626 m)   Wt 75 kg   SpO2 97%   BMI 28.38 kg/m  SpO2: SpO2: 97 % O2 Device: O2 Device: Room Air O2 Flow Rate:    Intake/output summary:   Intake/Output Summary (Last 24 hours) at 07/19/2018 1039 Last data filed at 07/18/2018 1900 Gross per 24 hour  Intake -  Output 300 ml  Net -300 ml   LBM: Last BM Date: 07/18/18 Baseline Weight: Weight: 75 kg Most recent weight: Weight: 75 kg       Palliative Assessment/Data: PPS 20%    Flowsheet Rows     Most Recent Value  Intake Tab  Referral Department  Hospitalist  Unit at Time of Referral  Intermediate Care Unit  Palliative Care Primary Diagnosis  Cancer  Date Notified  07/18/18  Palliative Care Type  New Palliative care  Reason for referral  Clarify Goals of Care  Date of Admission  07/17/18  Date first  seen by Palliative Care  07/18/18  # of days Palliative referral response time  0 Day(s)  # of days IP prior to Palliative referral  1  Clinical Assessment  Palliative Performance Scale Score  10%  Psychosocial & Spiritual Assessment  Palliative Care Outcomes  Patient/Family meeting held?  Yes  Who was at the meeting?  son  Palliative Care Outcomes  Improved pain interventions, Improved non-pain symptom therapy, Clarified goals of care, Counseled regarding hospice, Provided end of life care assistance, Provided psychosocial or spiritual support, Changed to focus on comfort, Transitioned to hospice      Patient Active Problem List   Diagnosis Date Noted  . Goals of care, counseling/discussion   . Palliative care by specialist   . End of life care   . Agitation   . Brain metastases ([place]) 07/17/2018  . Oral candidiasis 07/17/2018  . Unspecified atrial fibrillation ([place]) 07/17/2018  . ICH (intracerebral hemorrhage) ([place]) 07/17/2018  . Dysarthria 07/17/2018  . Emphysema/COPD ([place])   . CHF (congestive heart failure) [facility])     Palliative Care Assessment & Plan   HPI: 85 y.o. female  with past medical history of COPD, CHF, a fib on anticoagulation, and HTN admitted on 07/17/2018 with AMS. Patient was living at [facility] - transferred to [facility] d/t AMS and then transferred to  [facility].  CT angiogram revealed 2 hemorrhagic masses in her brain. Also found to have mass in left upper lobe of lung concerning for bronchogenic carcinoma. PMT consulted for [place].  Assessment: Update from palliative Rn received - patient continues with full comfort care. Diet liberated per her request - administer foods she requests when she is awake enough to safely maintain PO intake.   Per Education officer, museum note, her ALF has accepted her back with hospice to care for her there.  Called her son, Amy Wilkerson to provide update. We discussed symptom check this morning and her comfort. Goals are clear for full comfort care.  He is glad patient can return to her ALF where she knows everyone and is comfortable. He asks me about prognosis - we discussed that if patient continues to refuse PO intake as she has while hospitalized time is likely limited to weeks or less. We discussed that if intake improves somewhat time may be a few months, but she is at risk for acute events. He expresses understanding that she is nearing end of life.  Amy Wilkerson expresses gratitude for conversation and will reach out with any further questions or concerns. He is hopeful his mom can be discharged today.   Recommendations/Plan: - full comfort care - no labs, IV fluids, no cardiac monitor - orders have been adjusted - recommend continue catheter on discharge for comfort - continue DNR status - added meds for comfort including ativan and morphine - hopeful for discharge today - hospice care at her ALF - diet liberalized per patient's request - can eat if awake enough to safely manage PO intake  Goals of Care and Additional Recommendations:  Limitations on Scope of Treatment: Full Comfort Care  Code Status:  DNR  Prognosis:   < 2 weeks is likely d/t refusal of all PO intake  Discharge Planning:  [place] with Hospice  Care plan was discussed with palliative RN and son Amy Wilkerson  Thank you for allowing the Palliative Medicine Team to assist in the care of this patient.   Total Time 25 minutes Prolonged Time Billed  no       Greater than 50%  of this time was spent counseling and coordinating care related to the above assessment and plan.  Juel Burrow, DNP, [facility] Palliative Medicine Team Team Phone # [phone]  Pager [phone]

## 2018-07-19 NOTE — Plan of Care (Signed)
Adequate for discharge.

## 2018-07-21 DIAGNOSIS — R918 Other nonspecific abnormal finding of lung field: Secondary | ICD-10-CM | POA: Diagnosis not present

## 2018-07-21 DIAGNOSIS — R54 Age-related physical debility: Secondary | ICD-10-CM | POA: Diagnosis not present

## 2018-07-21 DIAGNOSIS — I509 Heart failure, unspecified: Secondary | ICD-10-CM | POA: Diagnosis not present

## 2018-07-21 DIAGNOSIS — I619 Nontraumatic intracerebral hemorrhage, unspecified: Secondary | ICD-10-CM | POA: Diagnosis not present

## 2018-07-21 DIAGNOSIS — C719 Malignant neoplasm of brain, unspecified: Secondary | ICD-10-CM | POA: Diagnosis not present

## 2018-07-21 DIAGNOSIS — C3490 Malignant neoplasm of unspecified part of unspecified bronchus or lung: Secondary | ICD-10-CM | POA: Diagnosis not present

## 2018-07-21 DIAGNOSIS — J449 Chronic obstructive pulmonary disease, unspecified: Secondary | ICD-10-CM | POA: Diagnosis not present

## 2018-07-21 DIAGNOSIS — I4891 Unspecified atrial fibrillation: Secondary | ICD-10-CM | POA: Diagnosis not present

## 2018-07-21 DIAGNOSIS — I10 Essential (primary) hypertension: Secondary | ICD-10-CM | POA: Diagnosis not present

## 2018-07-24 ENCOUNTER — Inpatient Hospital Stay: Payer: Medicare Other

## 2018-08-02 DIAGNOSIS — F0390 Unspecified dementia without behavioral disturbance: Secondary | ICD-10-CM | POA: Diagnosis not present

## 2018-08-02 DIAGNOSIS — L89512 Pressure ulcer of right ankle, stage 2: Secondary | ICD-10-CM | POA: Diagnosis not present

## 2018-08-02 DIAGNOSIS — L309 Dermatitis, unspecified: Secondary | ICD-10-CM | POA: Diagnosis not present

## 2018-09-01 DIAGNOSIS — R54 Age-related physical debility: Secondary | ICD-10-CM | POA: Diagnosis not present

## 2018-09-01 DIAGNOSIS — Z9119 Patient's noncompliance with other medical treatment and regimen: Secondary | ICD-10-CM | POA: Diagnosis not present

## 2018-09-01 DIAGNOSIS — J449 Chronic obstructive pulmonary disease, unspecified: Secondary | ICD-10-CM | POA: Diagnosis not present

## 2018-10-03 DIAGNOSIS — R918 Other nonspecific abnormal finding of lung field: Secondary | ICD-10-CM | POA: Diagnosis not present

## 2018-10-03 DIAGNOSIS — I619 Nontraumatic intracerebral hemorrhage, unspecified: Secondary | ICD-10-CM | POA: Diagnosis not present

## 2018-10-03 DIAGNOSIS — C719 Malignant neoplasm of brain, unspecified: Secondary | ICD-10-CM | POA: Diagnosis not present

## 2018-10-03 DIAGNOSIS — R4181 Age-related cognitive decline: Secondary | ICD-10-CM | POA: Diagnosis not present

## 2018-10-03 DIAGNOSIS — G894 Chronic pain syndrome: Secondary | ICD-10-CM | POA: Diagnosis not present

## 2018-10-03 DIAGNOSIS — C3490 Malignant neoplasm of unspecified part of unspecified bronchus or lung: Secondary | ICD-10-CM | POA: Diagnosis not present

## 2018-10-06 ENCOUNTER — Other Ambulatory Visit: Payer: Self-pay

## 2018-10-07 DEATH — deceased

## 2020-09-28 IMAGING — CT CT ABDOMEN AND PELVIS WITH CONTRAST
2 of 5 series · 14 of 46 positions shown, 16 images · IV contrast (APPLIED)
Comparison: None.

CLINICAL DATA: Left upper lobe mass

EXAM:
CT CHEST, ABDOMEN, AND PELVIS WITH CONTRAST
TECHNIQUE: Multidetector CT imaging of the chest, abdomen and pelvis was
performed following the standard protocol during bolus
administration of intravenous contrast.
CONTRAST:  100mL OMNIPAQUE IOHEXOL 300 MG/ML  SOLN

[Series 3: cap 5.0 i31f 2 · axial · 0.94mm/px · z∈[-797,-272]mm · 11 of 127 slices shown, 13 images]
[im 11/127  soft-tissue]
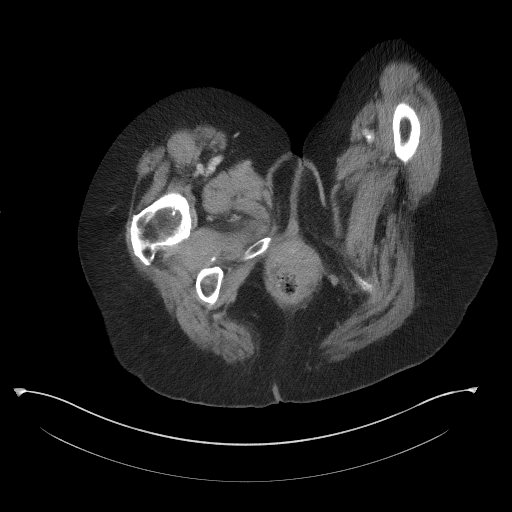
[im 11/127  bone]
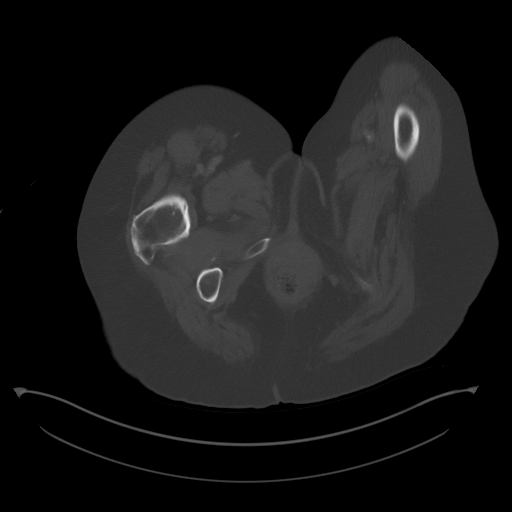
[im 22/127  soft-tissue]
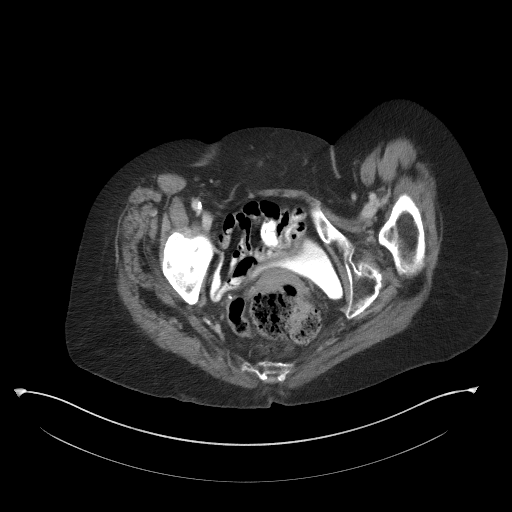
[im 32/127  soft-tissue]
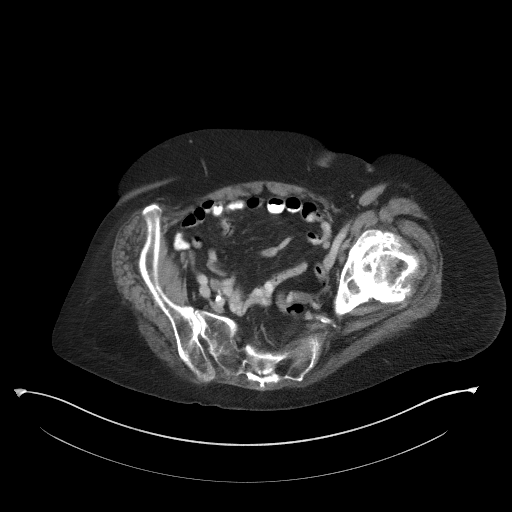
[im 43/127  soft-tissue]
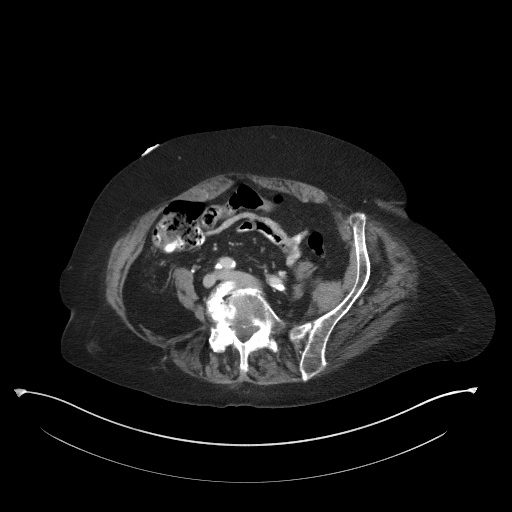
[im 53/127  soft-tissue]
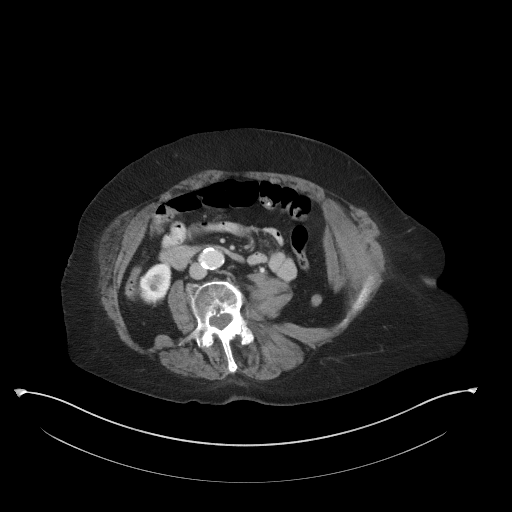
[im 64/127  soft-tissue]
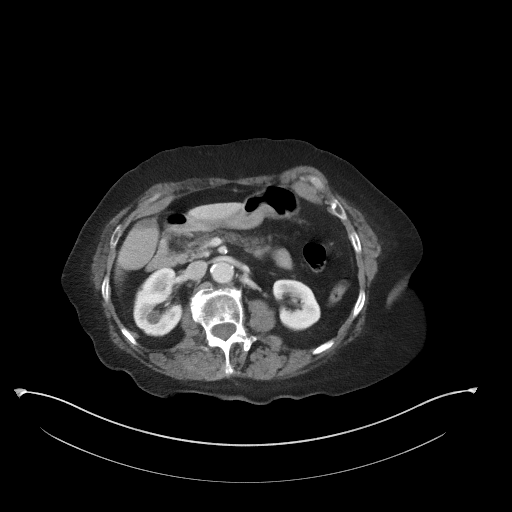
[im 74/127  soft-tissue]
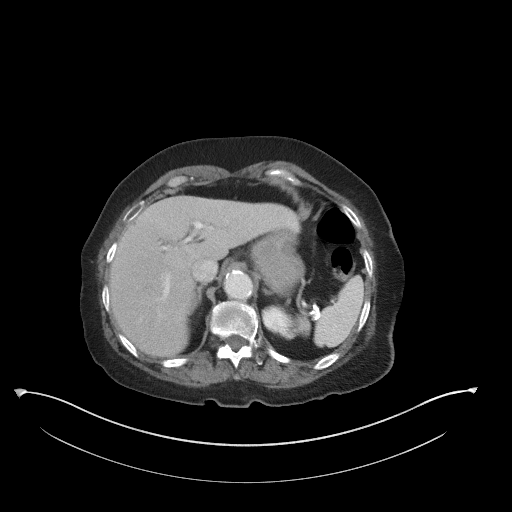
[im 85/127  soft-tissue]
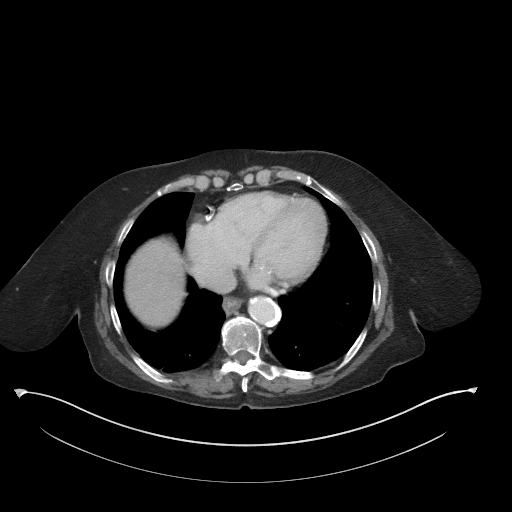
[im 95/127  soft-tissue]
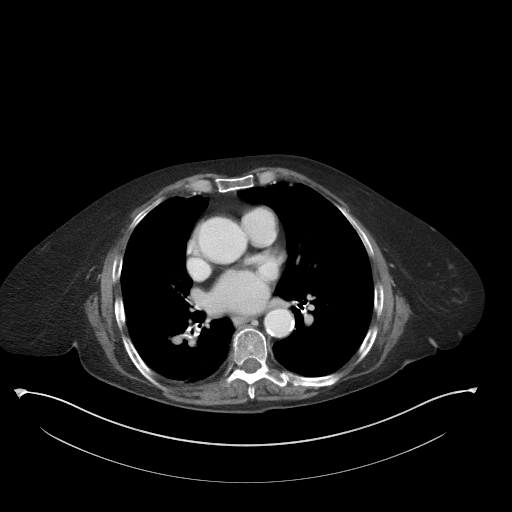
[im 95/127  bone]
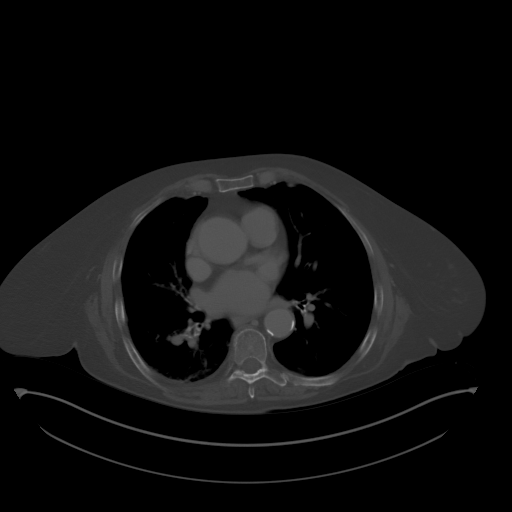
[im 106/127  soft-tissue]
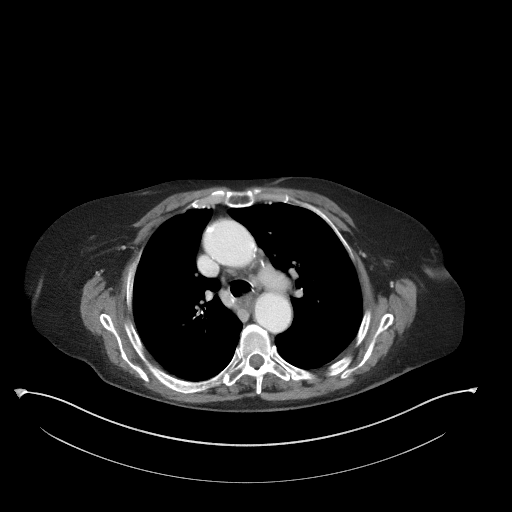
[im 116/127  soft-tissue]
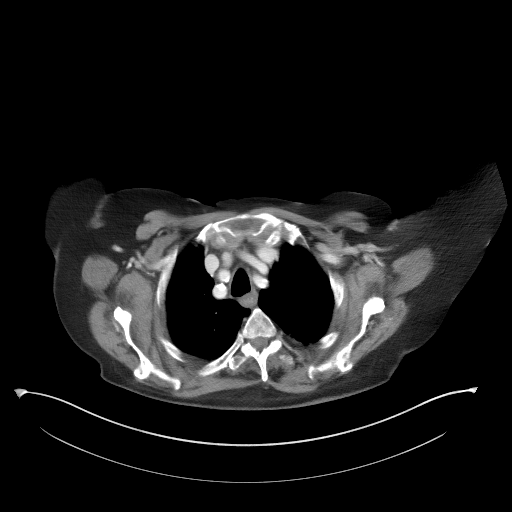

[Series 6: coronal · coronal · 0.85mm/px · 3 of 144 slices shown]
[im 48/144  soft-tissue]
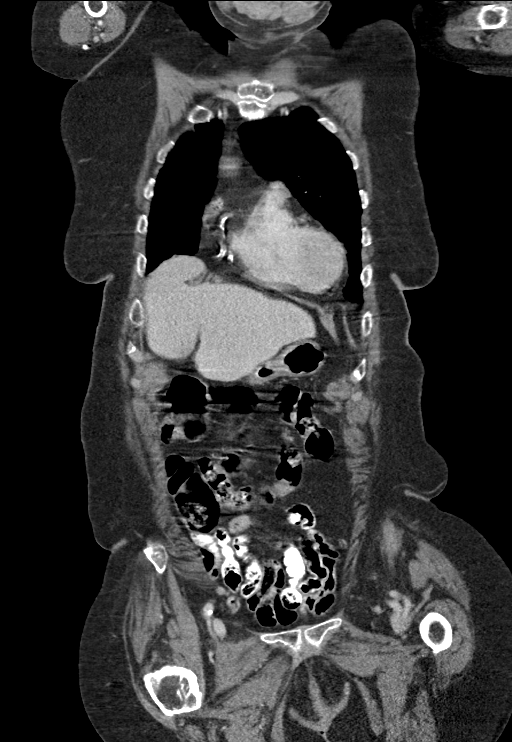
[im 64/144  soft-tissue]
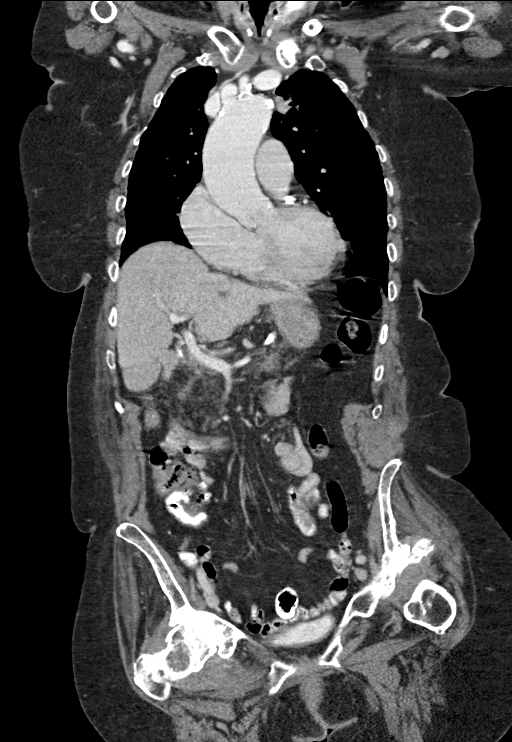
[im 80/144  soft-tissue]
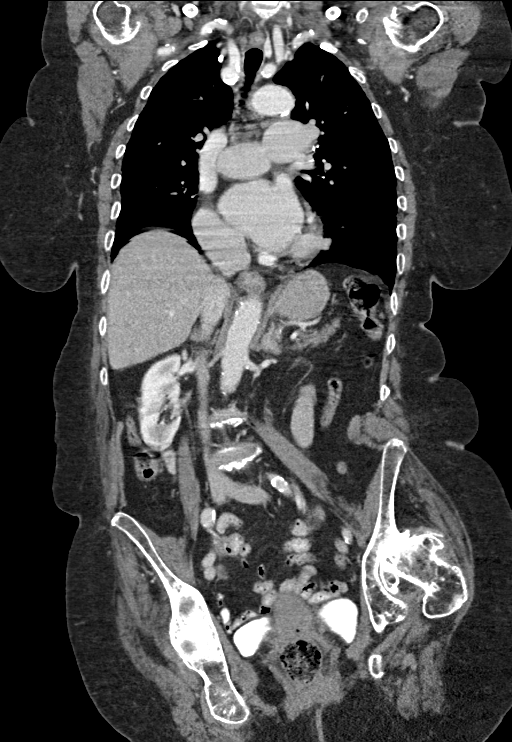

[14 of 46 positions shown; findings below may reference images not displayed]

FINDINGS: CT CHEST FINDINGS

Cardiovascular: Calcific atherosclerosis of the aorta. The ascending
aorta is dilated, measuring 4.5 cm. There is coronary artery
atherosclerosis. No pericardial effusion. Heart size is normal.

Mediastinum/Nodes: No enlarged mediastinal, hilar, or axillary lymph
nodes. Thyroid gland, trachea, and esophagus demonstrate no
significant findings.

Lungs/Pleura: Medial left upper lobe pulmonary nodule measures 2.2 x
1.4 cm. Dependent subsegmental atelectasis. No pleural effusion or
pneumothorax.

Musculoskeletal: Bilateral glenohumeral osteoarthrosis. No lytic or
sclerotic lesions.

CT ABDOMEN PELVIS FINDINGS

HEPATOBILIARY: Subcentimeter hypodensity in the right hepatic lobe,
too small characterize accurately. The gallbladder is normal. There
is vicarious excretion of contrast.

PANCREAS: Fatty atrophy of the pancreas.

SPLEEN: Normal.

ADRENALS/URINARY TRACT:

--Adrenal glands: Normal.

--Right kidney/ureter: No hydronephrosis, nephroureterolithiasis,
perinephric stranding or solid renal mass.

--Left kidney/ureter: No hydronephrosis, nephroureterolithiasis,
perinephric stranding or solid renal mass.

--Urinary bladder: Normal for degree of distention

STOMACH/BOWEL:

--Stomach/Duodenum: There is no hiatal hernia or other gastric
abnormality. The duodenal course and caliber are normal.

--Small bowel: No dilatation or inflammation.

--Colon: No focal abnormality.

--Appendix: Normal.

VASCULAR/LYMPHATIC: There is aortic atherosclerosis without
hemodynamically significant stenosis. The portal vein, splenic vein,
superior mesenteric vein and IVC are patent. No abdominal or pelvic
lymphadenopathy.

REPRODUCTIVE: Normal uterus and ovaries.

MUSCULOSKELETAL. Grade 1 L4-5 anterolisthesis secondary to facet
hypertrophy. Bilateral hip osteoarthrosis.

OTHER: None.
IMPRESSION: 1. 2.2 cm left upper lobe pulmonary nodule, concerning for
bronchogenic carcinoma.
2. No other pulmonary nodules or evidence of metastatic disease in
the chest.
3. Subcentimeter hypodensity within the right hepatic lobe, too
small to characterize accurately.
4. 4.4 cm ascending thoracic aorta. Recommend annual imaging
followup by CTA or MRA. This recommendation follows 3282
ACCF/AHA/AATS/ACR/ASA/SCA/KIRSCHNER/DELOWR/JURRIUS/GUZTAVO Guidelines for the
Diagnosis and Management of Patients with Thoracic Aortic Disease.
Circulation. 3282; 121: [id number]. Aortic aneurysm NOS (QEXGY-W6K.5)
Aortic atherosclerosis (QEXGY-TL7.7).
